# Patient Record
Sex: Female | Born: 1950 | Race: White | Hispanic: No | Marital: Single | Smoking: Never smoker
Health system: Southern US, Community
[De-identification: ages and names within clinical notes are randomized; demographics above are authoritative.]

## PROBLEM LIST (undated history)

## (undated) DIAGNOSIS — F319 Bipolar disorder, unspecified: Secondary | ICD-10-CM

---

## 2018-08-12 ENCOUNTER — Emergency Department: Payer: Self-pay

## 2018-08-12 ENCOUNTER — Other Ambulatory Visit: Payer: Self-pay

## 2018-08-12 ENCOUNTER — Inpatient Hospital Stay
Admission: EM | Admit: 2018-08-12 | Discharge: 2018-08-13 | DRG: 918 | Disposition: A | Discharge status: Home / Self Care | Payer: Self-pay | Attending: Internal Medicine | Admitting: Internal Medicine

## 2018-08-12 DIAGNOSIS — Z79899 Other long term (current) drug therapy: Secondary | ICD-10-CM

## 2018-08-12 DIAGNOSIS — T43591A Poisoning by other antipsychotics and neuroleptics, accidental (unintentional), initial encounter: Principal | ICD-10-CM | POA: Diagnosis present

## 2018-08-12 DIAGNOSIS — R112 Nausea with vomiting, unspecified: Secondary | ICD-10-CM | POA: Diagnosis present

## 2018-08-12 DIAGNOSIS — J069 Acute upper respiratory infection, unspecified: Secondary | ICD-10-CM | POA: Diagnosis present

## 2018-08-12 DIAGNOSIS — Z23 Encounter for immunization: Secondary | ICD-10-CM

## 2018-08-12 DIAGNOSIS — N179 Acute kidney failure, unspecified: Secondary | ICD-10-CM | POA: Diagnosis present

## 2018-08-12 DIAGNOSIS — T56891A Toxic effect of other metals, accidental (unintentional), initial encounter: Secondary | ICD-10-CM | POA: Diagnosis present

## 2018-08-12 DIAGNOSIS — R9431 Abnormal electrocardiogram [ECG] [EKG]: Secondary | ICD-10-CM | POA: Diagnosis present

## 2018-08-12 DIAGNOSIS — E871 Hypo-osmolality and hyponatremia: Secondary | ICD-10-CM | POA: Diagnosis present

## 2018-08-12 DIAGNOSIS — F319 Bipolar disorder, unspecified: Secondary | ICD-10-CM | POA: Diagnosis present

## 2018-08-12 DIAGNOSIS — E876 Hypokalemia: Secondary | ICD-10-CM | POA: Diagnosis present

## 2018-08-12 HISTORY — DX: Bipolar disorder, unspecified: F31.9

## 2018-08-12 LAB — TROPONIN I

## 2018-08-12 LAB — CBC WITH DIFFERENTIAL/PLATELET
Abs Immature Granulocytes: 0.19 10*3/uL — ABNORMAL HIGH (ref 0.00–0.07)
Basophils Absolute: 0.1 10*3/uL (ref 0.0–0.1)
Basophils Relative: 0 %
Eosinophils Absolute: 0.4 10*3/uL (ref 0.0–0.5)
Eosinophils Relative: 2 %
HCT: 37.4 % (ref 36.0–46.0)
HEMOGLOBIN: 12.7 g/dL (ref 12.0–15.0)
Immature Granulocytes: 1 %
Lymphocytes Relative: 13 %
Lymphs Abs: 2.3 10*3/uL (ref 0.7–4.0)
MCH: 30.8 pg (ref 26.0–34.0)
MCHC: 34 g/dL (ref 30.0–36.0)
MCV: 90.6 fL (ref 80.0–100.0)
Monocytes Absolute: 1.1 10*3/uL — ABNORMAL HIGH (ref 0.1–1.0)
Monocytes Relative: 7 %
Neutro Abs: 13.4 10*3/uL — ABNORMAL HIGH (ref 1.7–7.7)
Neutrophils Relative %: 77 %
Platelets: 371 10*3/uL (ref 150–400)
RBC: 4.13 MIL/uL (ref 3.87–5.11)
RDW: 13.6 % (ref 11.5–15.5)
WBC: 17.4 10*3/uL — ABNORMAL HIGH (ref 4.0–10.5)
nRBC: 0 % (ref 0.0–0.2)

## 2018-08-12 LAB — URINALYSIS, COMPLETE (UACMP) WITH MICROSCOPIC
Bilirubin Urine: NEGATIVE
Glucose, UA: NEGATIVE mg/dL
Hgb urine dipstick: NEGATIVE
Ketones, ur: NEGATIVE mg/dL
Leukocytes, UA: NEGATIVE
Nitrite: NEGATIVE
Protein, ur: NEGATIVE mg/dL
Specific Gravity, Urine: 1.011 (ref 1.005–1.030)
pH: 6 (ref 5.0–8.0)

## 2018-08-12 LAB — COMPREHENSIVE METABOLIC PANEL
ALBUMIN: 3.3 g/dL — AB (ref 3.5–5.0)
ALT: 15 U/L (ref 0–44)
ALT: 18 U/L (ref 0–44)
AST: 23 U/L (ref 15–41)
AST: 21 U/L (ref 15–41)
Albumin: 4.1 g/dL (ref 3.5–5.0)
Alkaline Phosphatase: 193 U/L — ABNORMAL HIGH (ref 38–126)
Alkaline Phosphatase: 155 U/L — ABNORMAL HIGH (ref 38–126)
Anion gap: 10 (ref 5–15)
Anion gap: 7 (ref 5–15)
BUN: 14 mg/dL (ref 8–23)
BUN: 14 mg/dL (ref 8–23)
CO2: 23 mmol/L (ref 22–32)
CO2: 19 mmol/L — ABNORMAL LOW (ref 22–32)
CREATININE: 1.35 mg/dL — AB (ref 0.44–1.00)
Calcium: 9 mg/dL (ref 8.9–10.3)
Calcium: 7.9 mg/dL — ABNORMAL LOW (ref 8.9–10.3)
Chloride: 101 mmol/L (ref 98–111)
Chloride: 109 mmol/L (ref 98–111)
Creatinine, Ser: 1.23 mg/dL — ABNORMAL HIGH (ref 0.44–1.00)
GFR calc Af Amer: 53 mL/min — ABNORMAL LOW (ref >60)
GFR calc non Af Amer: 41 mL/min — ABNORMAL LOW (ref >60)
GFR calc non Af Amer: 46 mL/min — ABNORMAL LOW (ref >60)
GFR, EST AFRICAN AMERICAN: 47 mL/min — AB (ref >60)
Glucose, Bld: 225 mg/dL — ABNORMAL HIGH (ref 70–99)
Glucose, Bld: 225 mg/dL — ABNORMAL HIGH (ref 70–99)
Potassium: 2.4 mmol/L — CL (ref 3.5–5.1)
Potassium: 3 mmol/L — ABNORMAL LOW (ref 3.5–5.1)
Sodium: 134 mmol/L — ABNORMAL LOW (ref 135–145)
Sodium: 135 mmol/L (ref 135–145)
Total Bilirubin: 0.8 mg/dL (ref 0.3–1.2)
Total Bilirubin: 0.7 mg/dL (ref 0.3–1.2)
Total Protein: 7.7 g/dL (ref 6.5–8.1)
Total Protein: 6.4 g/dL — ABNORMAL LOW (ref 6.5–8.1)

## 2018-08-12 LAB — BASIC METABOLIC PANEL
ANION GAP: 5 (ref 5–15)
BUN: 13 mg/dL (ref 8–23)
CALCIUM: 7.8 mg/dL — AB (ref 8.9–10.3)
CO2: 21 mmol/L — ABNORMAL LOW (ref 22–32)
Chloride: 109 mmol/L (ref 98–111)
Creatinine, Ser: 1.17 mg/dL — ABNORMAL HIGH (ref 0.44–1.00)
GFR calc Af Amer: 56 mL/min — ABNORMAL LOW (ref >60)
GFR calc non Af Amer: 49 mL/min — ABNORMAL LOW (ref >60)
Glucose, Bld: 214 mg/dL — ABNORMAL HIGH (ref 70–99)
Potassium: 3.3 mmol/L — ABNORMAL LOW (ref 3.5–5.1)
SODIUM: 135 mmol/L (ref 135–145)

## 2018-08-12 LAB — LITHIUM LEVEL
LITHIUM LVL: 1.93 mmol/L — AB (ref 0.60–1.20)
Lithium Lvl: 1.76 mmol/L (ref 0.60–1.20)
Lithium Lvl: 1.64 mmol/L (ref 0.60–1.20)
Lithium Lvl: 1.65 mmol/L (ref 0.60–1.20)
Lithium Lvl: 1.59 mmol/L (ref 0.60–1.20)

## 2018-08-12 LAB — URINE DRUG SCREEN, QUALITATIVE (ARMC ONLY)
Amphetamines, Ur Screen: NOT DETECTED
Barbiturates, Ur Screen: NOT DETECTED
Benzodiazepine, Ur Scrn: NOT DETECTED
Cannabinoid 50 Ng, Ur ~~LOC~~: NOT DETECTED
Cocaine Metabolite,Ur ~~LOC~~: NOT DETECTED
MDMA (Ecstasy)Ur Screen: NOT DETECTED
Methadone Scn, Ur: NOT DETECTED
Opiate, Ur Screen: NOT DETECTED
Phencyclidine (PCP) Ur S: NOT DETECTED
Tricyclic, Ur Screen: NOT DETECTED

## 2018-08-12 LAB — TSH: TSH: 2.331 u[IU]/mL (ref 0.350–4.500)

## 2018-08-12 LAB — ETHANOL: Alcohol, Ethyl (B): <10 mg/dL (ref <10)

## 2018-08-12 LAB — SALICYLATE LEVEL: Salicylate Lvl: <7 mg/dL (ref 2.8–30.0)

## 2018-08-12 LAB — INFLUENZA PANEL BY PCR (TYPE A & B)
Influenza A By PCR: NEGATIVE
Influenza B By PCR: NEGATIVE

## 2018-08-12 LAB — POTASSIUM: POTASSIUM: 3.7 mmol/L (ref 3.5–5.1)

## 2018-08-12 LAB — ACETAMINOPHEN LEVEL: Acetaminophen (Tylenol), Serum: <10 ug/mL — ABNORMAL LOW (ref 10–30)

## 2018-08-12 LAB — LIPASE, BLOOD: Lipase: 55 U/L — ABNORMAL HIGH (ref 11–51)

## 2018-08-12 LAB — BRAIN NATRIURETIC PEPTIDE: B Natriuretic Peptide: 22 pg/mL (ref 0.0–100.0)

## 2018-08-12 LAB — PHOSPHORUS
PHOSPHORUS: 1.9 mg/dL — AB (ref 2.5–4.6)
Phosphorus: 1.9 mg/dL — ABNORMAL LOW (ref 2.5–4.6)

## 2018-08-12 LAB — MAGNESIUM
Magnesium: 2.7 mg/dL — ABNORMAL HIGH (ref 1.7–2.4)
Magnesium: 2.7 mg/dL — ABNORMAL HIGH (ref 1.7–2.4)

## 2018-08-12 MED ORDER — ACETAMINOPHEN 650 MG RE SUPP: 650.0000 mg | Freq: Four times a day (QID) | RECTAL | Status: DC | PRN

## 2018-08-12 MED ORDER — MAGNESIUM SULFATE 2 GM/50ML IV SOLN
2.0000 g | Freq: Once | INTRAVENOUS | Status: AC
  Administered 2018-08-12: 2 g via INTRAVENOUS
  Filled 2018-08-12: qty 50

## 2018-08-12 MED ORDER — POTASSIUM CHLORIDE CRYS ER 20 MEQ PO TBCR
40.0000 meq | EXTENDED_RELEASE_TABLET | Freq: Once | ORAL | Status: AC
  Administered 2018-08-12: 40 meq via ORAL
  Filled 2018-08-12: qty 2

## 2018-08-12 MED ORDER — SODIUM CHLORIDE 0.9 % IV BOLUS
1000.0000 mL | Freq: Once | INTRAVENOUS | Status: AC
  Administered 2018-08-12: 1000 mL via INTRAVENOUS

## 2018-08-12 MED ORDER — ACETAMINOPHEN 500 MG PO TABS
1000.0000 mg | ORAL_TABLET | Freq: Once | ORAL | Status: AC
  Administered 2018-08-12: 1000 mg via ORAL
  Filled 2018-08-12: qty 2

## 2018-08-12 MED ORDER — KETOROLAC TROMETHAMINE 30 MG/ML IJ SOLN
15.0000 mg | Freq: Once | INTRAMUSCULAR | Status: AC
  Administered 2018-08-12: 15 mg via INTRAVENOUS
  Filled 2018-08-12: qty 1

## 2018-08-12 MED ORDER — ACETAMINOPHEN 325 MG PO TABS
650.0000 mg | ORAL_TABLET | Freq: Four times a day (QID) | ORAL | Status: DC | PRN
  Administered 2018-08-12 – 2018-08-13 (×3): 650 mg via ORAL
  Filled 2018-08-12 (×3): qty 2

## 2018-08-12 MED ORDER — ENOXAPARIN SODIUM 40 MG/0.4ML ~~LOC~~ SOLN
40.0000 mg | SUBCUTANEOUS | Status: DC
  Administered 2018-08-12: 40 mg via SUBCUTANEOUS
  Filled 2018-08-12: qty 0.4

## 2018-08-12 MED ORDER — ZOLPIDEM TARTRATE 5 MG PO TABS
5.0000 mg | ORAL_TABLET | Freq: Every evening | ORAL | Status: DC | PRN
  Administered 2018-08-13: 5 mg via ORAL
  Filled 2018-08-12: qty 1

## 2018-08-12 MED ORDER — SODIUM CHLORIDE 0.9 % IV SOLN
INTRAVENOUS | Status: DC
  Administered 2018-08-12 – 2018-08-13 (×3): via INTRAVENOUS

## 2018-08-12 MED ORDER — OXYMETAZOLINE HCL 0.05 % NA SOLN
1.0000 | Freq: Once | NASAL | Status: AC
  Administered 2018-08-12: 1 via NASAL
  Filled 2018-08-12: qty 15

## 2018-08-12 MED ORDER — POTASSIUM CHLORIDE CRYS ER 20 MEQ PO TBCR
80.0000 meq | EXTENDED_RELEASE_TABLET | Freq: Once | ORAL | Status: AC
  Administered 2018-08-12: 80 meq via ORAL
  Filled 2018-08-12: qty 4

## 2018-08-12 MED ORDER — DOCUSATE SODIUM 100 MG PO CAPS
100.0000 mg | ORAL_CAPSULE | Freq: Two times a day (BID) | ORAL | Status: DC
  Administered 2018-08-12: 100 mg via ORAL
  Filled 2018-08-12 (×3): qty 1

## 2018-08-12 MED ORDER — ONDANSETRON HCL 4 MG/2ML IJ SOLN
4.0000 mg | Freq: Four times a day (QID) | INTRAMUSCULAR | Status: DC | PRN
  Administered 2018-08-12 (×2): 4 mg via INTRAVENOUS
  Filled 2018-08-12 (×2): qty 2

## 2018-08-12 MED ORDER — ONDANSETRON HCL 4 MG/2ML IJ SOLN
4.0000 mg | Freq: Once | INTRAMUSCULAR | Status: AC
  Administered 2018-08-12: 4 mg via INTRAVENOUS
  Filled 2018-08-12: qty 2

## 2018-08-12 MED ORDER — POTASSIUM PHOSPHATES 15 MMOLE/5ML IV SOLN
25.0000 mmol | Freq: Once | INTRAVENOUS | Status: AC
  Administered 2018-08-12: 25 mmol via INTRAVENOUS
  Filled 2018-08-12: qty 8.33

## 2018-08-12 MED ORDER — PSEUDOEPHEDRINE HCL 30 MG PO TABS
60.0000 mg | ORAL_TABLET | Freq: Once | ORAL | Status: AC
  Administered 2018-08-12: 60 mg via ORAL
  Filled 2018-08-12: qty 2

## 2018-08-12 MED ORDER — INFLUENZA VAC SPLIT HIGH-DOSE 0.5 ML IM SUSY
0.5000 mL | PREFILLED_SYRINGE | INTRAMUSCULAR | Status: AC
  Administered 2018-08-13: 0.5 mL via INTRAMUSCULAR
  Filled 2018-08-12: qty 0.5

## 2018-08-12 NOTE — Consult Note (Signed)
[facility]  CONSULT NOTE    Date: 08/12/2018                  Patient Name:  Jasmine Simmons  MRN: [id number]  DOB: [birth year]  Age / Sex: 68 y.o., female         PCP: Patient, No Pcp Per                 Service Requesting Consult: Dr. Sheryle Hail                 Reason for Consult: Lithium toxicity            History of Present Illness: Jasmine Simmons is a 68 y.o. white female with bipolar disorder, who was admitted to [facility] on 08/12/2018 for QT prolongation [R94.31] Lithium toxicity, accidental or unintentional, initial encounter [T56.891A]  Nephrology consulted. No known baseline creatinine.   Patient states that she was unsure how many lithium tablets she took as she has been sick lately.    Medications: Outpatient medications: No medications prior to admission.    Current medications: Current Facility-Administered Medications  Medication Dose Route Frequency Provider Last Rate Last Dose  . 0.9 %  sodium chloride infusion   Intravenous Continuous Arnaldo Natal, MD 100 mL/hr at 08/12/18 0640    . acetaminophen (TYLENOL) tablet 650 mg  650 mg Oral Q6H PRN Arnaldo Natal, MD   650 mg at 08/12/18 1304   Or  . acetaminophen (TYLENOL) suppository 650 mg  650 mg Rectal Q6H PRN Arnaldo Natal, MD      . docusate sodium (COLACE) capsule 100 mg  100 mg Oral BID Arnaldo Natal, MD   100 mg at 08/12/18 1037  . enoxaparin (LOVENOX) injection 40 mg  40 mg Subcutaneous Q24H Arnaldo Natal, MD      . Melene Muller ON 08/13/2018] Influenza vac split quadrivalent PF (FLUZONE HIGH-DOSE) injection 0.5 mL  0.5 mL Intramuscular Tomorrow-1000 Arnaldo Natal, MD      . ondansetron [facility]) injection 4 mg  4 mg Intravenous Q6H PRN Arnaldo Natal, MD   4 mg at 08/12/18 1310  . potassium PHOSPHATE 25 mmol in dextrose 5 % 500 mL infusion  25 mmol Intravenous Once Little Ishikawa, RPH 85 mL/hr at 08/12/18 1310 25 mmol at 08/12/18 1310       Allergies: No Known Allergies    Past Medical History: Past Medical History:  Diagnosis Date  . Bipolar 1 disorder [facility])      Past Surgical History: Past Surgical History:  Procedure Laterality Date  . CESAREAN SECTION       Family History: Family History  Family history unknown: Yes     Social History: Social History   Socioeconomic History  . Marital status: Single    Spouse name: Not on file  . Number of children: Not on file  . Years of education: Not on file  . Highest education level: Not on file  Occupational History  . Not on file  Social Needs  . Financial resource strain: Not on file  . Food insecurity:    Worry: Not on file    Inability: Not on file  . Transportation needs:    Medical: Not on file    Non-medical: Not on file  Tobacco Use  . Smoking status: Never Smoker  . Smokeless tobacco: Never Used  Substance and Sexual Activity  . Alcohol use: Never    Frequency: Never  .  Drug use: Never  . Sexual activity: Not on file  Lifestyle  . Physical activity:    Days per week: Not on file    Minutes per session: Not on file  . Stress: Not on file  Relationships  . Social connections:    Talks on phone: Not on file    Gets together: Not on file    Attends religious service: Not on file    Active member of club or organization: Not on file    Attends meetings of clubs or organizations: Not on file    Relationship status: Not on file  . Intimate partner violence:    Fear of current or ex partner: Not on file    Emotionally abused: Not on file    Physically abused: Not on file    Forced sexual activity: Not on file  Other Topics Concern  . Not on file  Social History Narrative  . Not on file     Review of Systems: Review of Systems  Constitutional: Positive for malaise/fatigue. Negative for chills, diaphoresis, fever and weight loss.  HENT: Negative.  Negative for congestion, ear discharge, ear pain, hearing loss, nosebleeds,  sinus pain, sore throat and tinnitus.   Eyes: Negative.  Negative for blurred vision, double vision, photophobia, pain, discharge and redness.  Respiratory: Negative.  Negative for cough, hemoptysis, sputum production, shortness of breath, wheezing and stridor.   Cardiovascular: Negative.  Negative for chest pain, palpitations, orthopnea, claudication, leg swelling and PND.  Gastrointestinal: Positive for abdominal pain, heartburn, nausea and vomiting. Negative for blood in stool, constipation, diarrhea and melena.  Genitourinary: Negative.  Negative for dysuria, flank pain, frequency, hematuria and urgency.  Musculoskeletal: Negative.  Negative for back pain, falls, joint pain, myalgias and neck pain.  Skin: Negative.  Negative for itching and rash.  Neurological: Negative.  Negative for dizziness, tingling, tremors, sensory change, speech change, focal weakness, seizures, loss of consciousness, weakness and headaches.  Endo/Heme/Allergies: Negative.  Negative for environmental allergies and polydipsia. Does not bruise/bleed easily.  Psychiatric/Behavioral: Positive for depression and memory loss. Negative for hallucinations, substance abuse and suicidal ideas. The patient is nervous/anxious. The patient does not have insomnia.     Vital Signs: Blood pressure 111/63, pulse 70, temperature 97.6 F (36.4 C), temperature source Oral, resp. rate 20, height 5\' 4"  (1.626 m), weight 82.9 kg, SpO2 97 %.  Weight trends: Filed Weights   08/12/18 0024 08/12/18 0500  Weight: 54.4 kg 82.9 kg    Physical Exam: General: NAD, laying in bed  Head: Normocephalic, atraumatic. Moist oral mucosal membranes  Eyes: Anicteric, PERRL  Neck: Supple, trachea midline  Lungs:  Clear to auscultation  Heart: Regular rate and rhythm  Abdomen:  Soft, nontender,   Extremities:  no peripheral edema.  Neurologic: Nonfocal, moving all four extremities  Skin: No lesions        Lab results: Basic Metabolic  Panel: Recent Labs  Lab 08/12/18 0031 08/12/18 0809 08/12/18 1244  NA 134* 135 135  K 2.4* 3.0* 3.3*  CL 101 109 109  CO2 23 19* 21*  GLUCOSE 225* 225* 214*  BUN 14 14 13   CREATININE 1.35* 1.23* 1.17*  CALCIUM 9.0 7.9* 7.8*  MG  --  2.7* 2.7*  PHOS  --  1.9* 1.9*    Liver Function Tests: Recent Labs  Lab 08/12/18 0031 08/12/18 0809  AST 23 21  ALT 18 15  ALKPHOS 193* 155*  BILITOT 0.8 0.7  PROT 7.7 6.4*  ALBUMIN 4.1  3.3*   Recent Labs  Lab 08/12/18 0031  LIPASE 55*   No results for input(s): AMMONIA in the last 168 hours.  CBC: Recent Labs  Lab 08/12/18 0031  WBC 17.4*  NEUTROABS 13.4*  HGB 12.7  HCT 37.4  MCV 90.6  PLT 371    Cardiac Enzymes: Recent Labs  Lab 08/12/18 0031  TROPONINI <0.03    BNP: Invalid input(s): POCBNP  CBG: No results for input(s): GLUCAP in the last 168 hours.  Microbiology: No results found for this or any previous visit.  Coagulation Studies: No results for input(s): LABPROT, INR in the last 72 hours.  Urinalysis: Recent Labs    08/12/18 0504  COLORURINE YELLOW*  LABSPEC 1.011  PHURINE 6.0  GLUCOSEU NEGATIVE  HGBUR NEGATIVE  BILIRUBINUR NEGATIVE  KETONESUR NEGATIVE  PROTEINUR NEGATIVE  NITRITE NEGATIVE  LEUKOCYTESUR NEGATIVE      Imaging: Dg Chest 2 View  Result Date: 08/12/2018 CLINICAL DATA:  Flu like symptoms and body aches. EXAM: CHEST - 2 VIEW COMPARISON:  None. FINDINGS: The heart size and mediastinal contours are within normal limits. Both lungs are clear. The visualized skeletal structures are unremarkable. IMPRESSION: No active cardiopulmonary disease. Electronically Signed   By: Tollie Ethavid  Kwon M.D.   On: 08/12/2018 01:00      Assessment & Plan: Ms. Oren SectionLeslie O'Dell is a 68 y.o. white female with bipolar disorder, who was admitted to [facility] on 08/12/2018 for QT prolongation [R94.31] Lithium toxicity, accidental or unintentional, initial encounter [T56.891A]  1. Acute renal failure with unknown  baseline 2. Lithium toxicity. Normal range is 0.8-1.2 3. Hyponatremia  Continue IV fluids. Monitor lithium levels.   LOS: 0 Eddrick Dilone 1/24/20202:02 PM

## 2018-08-12 NOTE — H&P (Signed)
Jasmine Simmons is an 68 y.o. female.   Chief Complaint: Nausea HPI: The patient with past medical history of bipolar disorder presents to the emergency department with nausea and vomiting.  Symptoms began yesterday.  The patient has had nonbloody nonbilious emesis.  She obtains a large supply of her medicines from [place].  She admits that she may have taken more lithium than intended due to general malaise and absentmindedness during a brief viral illness that preceded current symptoms by 2 weeks.  The patient was found to have an elevated lithium level which prompted the emergency department staff to call the hospitalist service for admission  Past Medical History:  Diagnosis Date  . Bipolar 1 disorder [facility])     Past Surgical History:  Procedure Laterality Date  . CESAREAN SECTION      Family History  Family history unknown: Yes   Social History:  reports that she has never smoked. She has never used smokeless tobacco. She reports that she does not drink alcohol or use drugs.  Allergies: No Known Allergies  No medications prior to admission.    Results for orders placed or performed during the hospital encounter of 08/12/18 (from the past 48 hour(s))  Comprehensive metabolic panel     Status: Abnormal   Collection Time: 08/12/18 12:31 AM  Result Value Ref Range   Sodium 134 (L) 135 - 145 mmol/L   Potassium 2.4 (LL) 3.5 - 5.1 mmol/L    Comment: CRITICAL RESULT CALLED TO, READ BACK BY AND VERIFIED WITH GRACIE WINEMEN @0116  08/12/2018 TTG    Chloride 101 98 - 111 mmol/L   CO2 23 22 - 32 mmol/L   Glucose, Bld 225 (H) 70 - 99 mg/dL   BUN 14 8 - 23 mg/dL   Creatinine, Ser 7.91 (H) 0.44 - 1.00 mg/dL   Calcium 9.0 8.9 - 50.5 mg/dL   Total Protein 7.7 6.5 - 8.1 g/dL   Albumin 4.1 3.5 - 5.0 g/dL   AST 23 15 - 41 U/L   ALT 18 0 - 44 U/L   Alkaline Phosphatase 193 (H) 38 - 126 U/L   Total Bilirubin 0.8 0.3 - 1.2 mg/dL   GFR calc non Af Amer 41 (L) >60 mL/min   GFR calc Af Amer 47 (L)  >60 mL/min   Anion gap 10 5 - 15    Comment: Performed at [facility], [address] [place], [place] [address]  Lipase, blood     Status: Abnormal   Collection Time: 08/12/18 12:31 AM  Result Value Ref Range   Lipase 55 (H) 11 - 51 U/L    Comment: Performed at [facility], [address] [place], [place] [address]  CBC with Differential     Status: Abnormal   Collection Time: 08/12/18 12:31 AM  Result Value Ref Range   WBC 17.4 (H) 4.0 - 10.5 K/uL   RBC 4.13 3.87 - 5.11 MIL/uL   Hemoglobin 12.7 12.0 - 15.0 g/dL   HCT 53.7 48.2 - 70.7 %   MCV 90.6 80.0 - 100.0 fL   MCH 30.8 26.0 - 34.0 pg   MCHC 34.0 30.0 - 36.0 g/dL   RDW 86.7 54.4 - 92.0 %   Platelets 371 150 - 400 K/uL   nRBC 0.0 0.0 - 0.2 %   Neutrophils Relative % 77 %   Neutro Abs 13.4 (H) 1.7 - 7.7 K/uL   Lymphocytes Relative 13 %   Lymphs Abs 2.3 0.7 - 4.0 K/uL  Monocytes Relative 7 %   Monocytes Absolute 1.1 (H) 0.1 - 1.0 K/uL   Eosinophils Relative 2 %   Eosinophils Absolute 0.4 0.0 - 0.5 K/uL   Basophils Relative 0 %   Basophils Absolute 0.1 0.0 - 0.1 K/uL   Immature Granulocytes 1 %   Abs Immature Granulocytes 0.19 (H) 0.00 - 0.07 K/uL    Comment: Performed at [facility], [address], [place], [place] [address]  Troponin I - Once     Status: None   Collection Time: 08/12/18 12:31 AM  Result Value Ref Range   Troponin I <0.03 <0.03 ng/mL    Comment: Performed at [facility], [address] [place], [place] [address]  Brain natriuretic peptide     Status: None   Collection Time: 08/12/18 12:31 AM  Result Value Ref Range   B Natriuretic Peptide 22.0 0.0 - 100.0 pg/mL    Comment: Performed at [facility], [address] [place], [place] [address]  Influenza panel by PCR (type A & B)     Status: None   Collection Time: 08/12/18 12:31 AM  Result Value Ref Range   Influenza A By PCR NEGATIVE NEGATIVE   Influenza B By PCR NEGATIVE NEGATIVE     Comment: (NOTE) The Xpert Xpress Flu assay is intended as an aid in the diagnosis of  influenza and should not be used as a sole basis for treatment.  This  assay is FDA approved for nasopharyngeal swab specimens only. Nasal  washings and aspirates are unacceptable for Xpert Xpress Flu testing. Performed at [facility], [address] [place], [place] [address]   Lithium level     Status: Abnormal   Collection Time: 08/12/18 12:31 AM  Result Value Ref Range   Lithium Lvl 1.93 (HH) 0.60 - 1.20 mmol/L    Comment: CRITICAL RESULT CALLED TO, READ BACK BY AND VERIFIED WITH GRACIE [facility] @0116  08/12/2018 TTG Performed at [facility], [address] [place], [place] [address]   Acetaminophen level     Status: Abnormal   Collection Time: 08/12/18 12:31 AM  Result Value Ref Range   Acetaminophen (Tylenol), Serum <10 (L) 10 - 30 ug/mL    Comment: (NOTE) Therapeutic concentrations vary significantly. A range of 10-30 ug/mL  may be an effective concentration for many patients. However, some  are best treated at concentrations outside of this range. Acetaminophen concentrations >150 ug/mL at 4 hours after ingestion  and >50 ug/mL at 12 hours after ingestion are often associated with  toxic reactions. Performed at [facility], [address] [place], [place] [address]   Salicylate level     Status: None   Collection Time: 08/12/18 12:31 AM  Result Value Ref Range   Salicylate Lvl <7.0 2.8 - 30.0 mg/dL    Comment: Performed at [facility], [address] [place], [place] [address]  Ethanol     Status: None   Collection Time: 08/12/18 12:31 AM  Result Value Ref Range   Alcohol, Ethyl (B) <10 <10 mg/dL    Comment: (NOTE) Lowest detectable limit for serum alcohol is 10 mg/dL. For medical purposes only. Performed at [facility], [address] [place], [place] [address]   Lithium level     Status: Abnormal   Collection Time:  08/12/18  4:47 AM  Result Value Ref Range   Lithium Lvl 1.76 (HH) 0.60 - 1.20 mmol/L    Comment: CRITICAL RESULT CALLED TO, READ BACK  BY AND VERIFIED WITH STACEY CLAY AT 1610 08/12/2018.PMF Performed at [facility], [address] [place], [place] [address]   TSH     Status: None   Collection Time: 08/12/18  4:47 AM  Result Value Ref Range   TSH 2.331 0.350 - 4.500 uIU/mL    Comment: Performed by a 3rd Generation assay with a functional sensitivity of <=0.01 uIU/mL. Performed at [facility], [address] [place], [place] [address]   Urinalysis, Complete w Microscopic     Status: Abnormal   Collection Time: 08/12/18  5:04 AM  Result Value Ref Range   Color, Urine YELLOW (A) YELLOW   APPearance CLEAR (A) CLEAR   Specific Gravity, Urine 1.011 1.005 - 1.030   pH 6.0 5.0 - 8.0   Glucose, UA NEGATIVE NEGATIVE mg/dL   Hgb urine dipstick NEGATIVE NEGATIVE   Bilirubin Urine NEGATIVE NEGATIVE   Ketones, ur NEGATIVE NEGATIVE mg/dL   Protein, ur NEGATIVE NEGATIVE mg/dL   Nitrite NEGATIVE NEGATIVE   Leukocytes, UA NEGATIVE NEGATIVE   RBC / HPF 0-5 0 - 5 RBC/hpf   WBC, UA 0-5 0 - 5 WBC/hpf   Bacteria, UA RARE (A) NONE SEEN   Squamous Epithelial / LPF 0-5 0 - 5   Mucus PRESENT     Comment: Performed at [facility], [address] [place], [place] [address]  Urine Drug Screen, Qualitative     Status: None   Collection Time: 08/12/18  5:04 AM  Result Value Ref Range   Tricyclic, Ur Screen NONE DETECTED NONE DETECTED   Amphetamines, Ur Screen NONE DETECTED NONE DETECTED   MDMA (Ecstasy)Ur Screen NONE DETECTED NONE DETECTED   Cocaine Metabolite,Ur [place] NONE DETECTED NONE DETECTED   Opiate, Ur Screen NONE DETECTED NONE DETECTED   Phencyclidine (PCP) Ur S NONE DETECTED NONE DETECTED   Cannabinoid 50 Ng, Ur [place] NONE DETECTED NONE DETECTED   Barbiturates, Ur Screen NONE DETECTED NONE DETECTED   Benzodiazepine, Ur Scrn NONE DETECTED NONE DETECTED   Methadone  Scn, Ur NONE DETECTED NONE DETECTED    Comment: (NOTE) Tricyclics + metabolites, urine    Cutoff 1000 ng/mL Amphetamines + metabolites, urine  Cutoff 1000 ng/mL MDMA (Ecstasy), urine              Cutoff 500 ng/mL Cocaine Metabolite, urine          Cutoff 300 ng/mL Opiate + metabolites, urine        Cutoff 300 ng/mL Phencyclidine (PCP), urine         Cutoff 25 ng/mL Cannabinoid, urine                 Cutoff 50 ng/mL Barbiturates + metabolites, urine  Cutoff 200 ng/mL Benzodiazepine, urine              Cutoff 200 ng/mL Methadone, urine                   Cutoff 300 ng/mL The urine drug screen provides only a preliminary, unconfirmed analytical test result and should not be used for non-medical purposes. Clinical consideration and professional judgment should be applied to any positive drug screen result due to possible interfering substances. A more specific alternate chemical method must be used in order to obtain a confirmed analytical result. Gas chromatography / mass spectrometry (GC/MS) is the preferred confirmat ory method. Performed at [facility], [address] [place], [place] [address]    Dg Chest 2 View  Result Date:  08/12/2018 CLINICAL DATA:  Flu like symptoms and body aches. EXAM: CHEST - 2 VIEW COMPARISON:  None. FINDINGS: The heart size and mediastinal contours are within normal limits. Both lungs are clear. The visualized skeletal structures are unremarkable. IMPRESSION: No active cardiopulmonary disease. Electronically Signed   By: Tollie Ethavid  Kwon M.D.   On: 08/12/2018 01:00    Review of Systems  Constitutional: Positive for malaise/fatigue. Negative for chills and fever.  HENT: Negative for sore throat and tinnitus.   Eyes: Negative for blurred vision and redness.  Respiratory: Negative for cough and shortness of breath.   Cardiovascular: Negative for chest pain, palpitations, orthopnea and PND.  Gastrointestinal: Positive for nausea and vomiting. Negative  for abdominal pain and diarrhea.  Genitourinary: Negative for dysuria, frequency and urgency.  Musculoskeletal: Negative for joint pain and myalgias.  Skin: Negative for rash.       No lesions  Neurological: Positive for tingling. Negative for speech change, focal weakness and weakness.  Endo/Heme/Allergies: Does not bruise/bleed easily.       No temperature intolerance  Psychiatric/Behavioral: Negative for depression and suicidal ideas.    Blood pressure 134/76, pulse 75, temperature 98.4 F (36.9 C), temperature source Oral, resp. rate 20, height 5\' 4"  (1.626 m), weight 82.9 kg, SpO2 96 %. Physical Exam  Vitals reviewed. Constitutional: She is oriented to person, place, and time. She appears well-developed and well-nourished. No distress.  HENT:  Head: Normocephalic and atraumatic.  Mouth/Throat: Oropharynx is clear and moist.  Eyes: Pupils are equal, round, and reactive to light. Conjunctivae and EOM are normal.  Neck: Normal range of motion. Neck supple. No JVD present. No tracheal deviation present. No thyromegaly present.  Cardiovascular: Normal rate, regular rhythm and normal heart sounds. Exam reveals no gallop and no friction rub.  No murmur heard. Respiratory: Effort normal and breath sounds normal. No respiratory distress. She has no wheezes.  GI: Soft. Bowel sounds are normal. She exhibits no distension. There is no abdominal tenderness.  Genitourinary:    Genitourinary Comments: Deferred   Musculoskeletal: Normal range of motion.        General: No edema.  Lymphadenopathy:    She has no cervical adenopathy.  Neurological: She is alert and oriented to person, place, and time. No cranial nerve deficit.  Skin: Skin is warm and dry. No rash noted. No erythema.  Psychiatric: Her behavior is normal. Judgment and thought content normal.     Assessment/Plan This is a 68 year old female admitted for lithium toxicity. 1.  Lithium toxicity: Patient does not meet criteria for  dialysis at this time.  Nonetheless we will consult nephrology for further guidance.  Monitor telemetry and urine output. 2.  Acute kidney injury: Secondary to poor p.o. intake and/or vomiting.  Hydrate with intravenous fluid.  Avoid nephrotoxic agents. 3.  Hypokalemia: Replete potassium 4.  Leukocytosis: Secondary to viral URI.  Monitor for signs or symptoms of sepsis 5.  DVT prophylaxis: Lovenox 6.  GI prophylaxis: None The patient is a full code.  Time spent on admission orders and patient care approximately 45 minutes  Arnaldo Nataliamond,  Heleena Miceli S, MD 08/12/2018, 8:29 AM

## 2018-08-12 NOTE — ED Notes (Signed)
Pt informed of urine sample needed.  

## 2018-08-12 NOTE — Progress Notes (Signed)
MD Messaged: Per night shift nurse, poison control called this morning around 6am. to see if labs could be order for this patient. They requested electrolytes to be obtained, magnesium, and EKG to be obtained.  They also requested a lithium level yet this is order already q4 and you just order a new level.

## 2018-08-12 NOTE — Progress Notes (Signed)
Called Dr. Anne Hahn regarding sleep medication to help with patient's anxiety.  Appropriate orders were placed.  Jasmine Simmons  08/12/2018  11:56 PM

## 2018-08-12 NOTE — Progress Notes (Signed)
Can you add additional PRN pain medication. Pain level 8/10.

## 2018-08-12 NOTE — ED Notes (Signed)
Date and time results received: 08/12/18 1:17 AM  (use smartphrase ".now" to insert current time)  Test: lithium/potassium Critical Value: 1.93/2.4  Name of Provider Notified: [email or web address]  Orders Received? Or Actions Taken?: Orders Received - See Orders for details

## 2018-08-12 NOTE — ED Triage Notes (Signed)
Pt to ED via EMS from home. Pt c/o flu liek symptoms, generalized body aches, n/v/d x2weeks. Pt has hx of bipolar disorder, takes prescribed lithium and has tremors from that. NAD. VSS.

## 2018-08-12 NOTE — ED Provider Notes (Signed)
[facility] Emergency Department Provider Note  ____________________________________________   First MD Initiated Contact with Patient 08/12/18 0022     (approximate)  I have reviewed the triage vital signs and the nursing notes.   HISTORY  Chief Complaint Nausea and Emesis   HPI Jasmine Simmons is a 68 y.o. female who comes to the emergency department via EMS with roughly 2 weeks of generalized malaise and "flulike symptoms".  Her symptoms have been gradual onset slowly progressive are now constant.  She came to the emergency department tonight because she feels tingling in all 4 extremities and says that she cannot stop shaking.  She denies fevers or chills.  She does report generalized malaise and body aches.  Some nausea but no vomiting.  Several loose stools.  No cough or shortness of breath.  She has a past medical history of bipolar 1 for which she takes lithium.  She did not take her daily lithium today.    Past Medical History:  Diagnosis Date  . Bipolar 1 disorder [facility](HCC)     Patient Active Problem List   Diagnosis Date Noted  . Lithium toxicity 08/12/2018      Prior to Admission medications   Not on File    Allergies Patient has no known allergies.  Family History  Family history unknown: Yes    Social History Social History   Tobacco Use  . Smoking status: Never Smoker  . Smokeless tobacco: Never Used  Substance Use Topics  . Alcohol use: Never    Frequency: Never  . Drug use: Never    Review of Systems Constitutional: No fever/chills Eyes: No visual changes. ENT: No sore throat. Cardiovascular: Denies chest pain. Respiratory: Denies shortness of breath. Gastrointestinal: Positive for abdominal pain.  Positive for nausea, positive for vomiting.  Positive for diarrhea.  No constipation. Genitourinary: Negative for dysuria. Musculoskeletal: Negative for back pain. Skin: Negative for rash. Neurological: Negative for  headaches, focal weakness or numbness.   ____________________________________________   PHYSICAL EXAM:  VITAL SIGNS: ED Triage Vitals  Enc Vitals Group     BP      Pulse      Resp      Temp      Temp src      SpO2      Weight      Height      Head Circumference      Peak Flow      Pain Score      Pain Loc      Pain Edu?      Excl. in GC?     Constitutional: Alert and oriented x4 somewhat anxious appearing although nontoxic no diaphoresis Eyes: PERRL EOMI. midrange and brisk no nystagmus Head: Atraumatic. Nose: No congestion/rhinnorhea. Mouth/Throat: No trismus Neck: No stridor.  No meningismus Cardiovascular: Normal rate, regular rhythm. Grossly normal heart sounds.  Good peripheral circulation. Respiratory: Normal respiratory effort.  No retractions. Lungs CTAB and moving good air Gastrointestinal: Soft nontender Musculoskeletal: No lower extremity edema   Neurologic:  Normal speech and language.  5-5 strength all 4 extremities.  She does have mild hand tremors and 3-4 beats of ankle clonus bilaterally.  3+ DTRs Skin:  Skin is warm, dry and intact. No rash noted. Psychiatric: Anxious appearing    ____________________________________________   DIFFERENTIAL includes but not limited to  Influenza, viral syndrome, dehydration, metabolic derangement, lithium toxicity ____________________________________________   LABS (all labs ordered are listed, but only abnormal results are displayed)  Labs Reviewed  COMPREHENSIVE METABOLIC PANEL - Abnormal; Notable for the following components:      Result Value   Sodium 134 (*)    Potassium 2.4 (*)    Glucose, Bld 225 (*)    Creatinine, Ser 1.35 (*)    Alkaline Phosphatase 193 (*)    GFR calc non Af Amer 41 (*)    GFR calc Af Amer 47 (*)    All other components within normal limits  LIPASE, BLOOD - Abnormal; Notable for the following components:   Lipase 55 (*)    All other components within normal limits  CBC WITH  DIFFERENTIAL/PLATELET - Abnormal; Notable for the following components:   WBC 17.4 (*)    Neutro Abs 13.4 (*)    Monocytes Absolute 1.1 (*)    Abs Immature Granulocytes 0.19 (*)    All other components within normal limits  LITHIUM LEVEL - Abnormal; Notable for the following components:   Lithium Lvl 1.93 (*)    All other components within normal limits  ACETAMINOPHEN LEVEL - Abnormal; Notable for the following components:   Acetaminophen (Tylenol), Serum <10 (*)    All other components within normal limits  URINALYSIS, COMPLETE (UACMP) WITH MICROSCOPIC - Abnormal; Notable for the following components:   Color, Urine YELLOW (*)    APPearance CLEAR (*)    Bacteria, UA RARE (*)    All other components within normal limits  LITHIUM LEVEL - Abnormal; Notable for the following components:   Lithium Lvl 1.76 (*)    All other components within normal limits  TROPONIN I  BRAIN NATRIURETIC PEPTIDE  INFLUENZA PANEL BY PCR (TYPE A & B)  SALICYLATE LEVEL  ETHANOL  URINE DRUG SCREEN, QUALITATIVE ([facility] ONLY)  TSH  LITHIUM LEVEL  LITHIUM LEVEL  LITHIUM LEVEL  MAGNESIUM  POTASSIUM  PHOSPHORUS  COMPREHENSIVE METABOLIC PANEL    Lab work reviewed by me shows the patient's lithium level is in the toxic range.  We have no baseline labs however her GFR is low.  Her potassium is quite low at 2.4 __________________________________________  EKG  ED ECG REPORT I, Merrily Brittle, the attending physician, personally viewed and interpreted this ECG.  Date: 08/12/2018 EKG Time:  Rate: 79 Rhythm: normal sinus rhythm QRS Axis: normal Intervals: Very prolonged QTC ST/T Wave abnormalities: normal Narrative Interpretation: no evidence of acute ischemia  ____________________________________________  RADIOLOGY  Chest x-ray reviewed by me with no acute disease ____________________________________________   PROCEDURES  Procedure(s) performed: no  .Critical Care Performed by: Merrily Brittle, MD Authorized by: Merrily Brittle, MD   Critical care provider statement:    Critical care time (minutes):  35   Critical care time was exclusive of:  Separately billable procedures and treating other patients   Critical care was necessary to treat or prevent imminent or life-threatening deterioration of the following conditions:  Toxidrome   Critical care was time spent personally by me on the following activities:  Development of treatment plan with patient or surrogate, discussions with consultants, evaluation of patient's response to treatment, examination of patient, obtaining history from patient or surrogate, ordering and performing treatments and interventions, ordering and review of laboratory studies, ordering and review of radiographic studies, pulse oximetry, re-evaluation of patient's condition and review of old charts    Critical Care performed: Yes  ____________________________________________   INITIAL IMPRESSION / ASSESSMENT AND PLAN / ED COURSE  Pertinent labs & imaging results that were available during my care of the patient were reviewed by me  and considered in my medical decision making (see chart for details).   As part of my medical decision making, I reviewed the following data within the electronic MEDICAL RECORD NUMBER History obtained from family if available, nursing notes, old chart and ekg, as well as notes from prior ED visits.  The patient comes to the emergency department with roughly 2 weeks of generalized malaise nausea vomiting diarrhea and myalgias.  She is somewhat tremulous.  Differential is broad but includes infectious versus metabolic and most notably she is on lithium and she could have lithium toxicity.  We will start with a liter of fluids and broad labs are pending.     I discussed the case with [facility] who recommends IV fluids to get her urine output to 1 to 2 cc/kg/h and to check lithium levels every 4 hours until her level  peaks.  No indication for hemodialysis as she has normal mental status despite her tingling, tremors, and hyper reflexivity.  Her QTC is quite prolonged at 557 so in addition to repleting her potassium aggressively we will also give her 2 g of magnesium.  I discussed with the hospitalist Dr. Anne HahnWillis who has graciously agreed to admit the patient to his service. ____________________________________________   FINAL CLINICAL IMPRESSION(S) / ED DIAGNOSES  Final diagnoses:  Lithium toxicity, accidental or unintentional, initial encounter  QT prolongation  Hypokalemia      NEW MEDICATIONS STARTED DURING THIS VISIT:  There are no discharge medications for this patient.    Note:  This document was prepared using Dragon voice recognition software and may include unintentional dictation errors.    Merrily Brittleifenbark, Jurnie Garritano, MD 08/12/18 [phone]

## 2018-08-12 NOTE — Progress Notes (Signed)
The patient is complaining of nause and Tremurs.

## 2018-08-12 NOTE — Consult Note (Signed)
PHARMACY CONSULT NOTE - FOLLOW UP  Pharmacy Consult for Electrolyte Monitoring and Replacement   Recent Labs: Potassium (mmol/L)  Date Value  08/12/2018 2.4 (LL)   Calcium (mg/dL)  Date Value  51/88/4166 9.0   Albumin (g/dL)  Date Value  01/17/1600 4.1   Sodium (mmol/L)  Date Value  08/12/2018 134 (L)     Assessment: 68 yo female with electrolyte imbalances.  Pharmacy consulted to assist in electrolyte management.  Goal of Therapy:  K ~ 4.0 Mg ~ 2.0 Phos - 2.5-4.5  Plan:  K - 3.0, s/p MD ordered KCl PO 80 mEq once in ED.  KPhos will provide approximately 36 mEq K+.  Will order an additional PO KCl 40 mEq once.  Will check K+ level at 1800.  Mg - 2.7, will hold replacement as level currently supratherapeutic.  Phos - 1.9, will order Potassium Phos IV 25 mmol.  Will recheck phos level with morning labs.  Will order BMP and Phos with morning labs.  Orinda Kenner ,PharmD Clinical Pharmacist 08/12/2018 8:04 AM

## 2018-08-12 NOTE — Progress Notes (Signed)
Patient ID: Jasmine Simmons, female   DOB: Sep 22, 1950, 68 y.o.   MRN: [id number]   Patient seen and chart reviewed.  Agree with admitting MD plan.  Lithium level ordered for tomorrow.

## 2018-08-12 NOTE — Consult Note (Signed)
PHARMACY CONSULT NOTE - FOLLOW UP  Pharmacy Consult for Electrolyte Monitoring and Replacement   Recent Labs: Potassium (mmol/L)  Date Value  08/12/2018 3.7   Magnesium (mg/dL)  Date Value  32/20/2542 2.7 (H)   Calcium (mg/dL)  Date Value  70/62/3762 7.8 (L)   Albumin (g/dL)  Date Value  83/15/1761 3.3 (L)   Phosphorus (mg/dL)  Date Value  60/73/7106 1.9 (L)   Sodium (mmol/L)  Date Value  08/12/2018 135     Assessment: 68 yo female with electrolyte imbalances.  Pharmacy consulted to assist in electrolyte management.  Goal of Therapy:  K ~ 4.0 Mg ~ 2.0 Phos - 2.5-4.5  Plan:  1/24 K: 3.7. KPhos IV still infusing. No additional potassium replacement needed at this time.   Will order BMP and Phos with morning labs.  Gardner Candle, PharmD, BCPS Clinical Pharmacist 08/12/2018 6:22 PM

## 2018-08-12 NOTE — Progress Notes (Signed)
Potassium level is 2.4

## 2018-08-13 LAB — BASIC METABOLIC PANEL
Anion gap: 4 — ABNORMAL LOW (ref 5–15)
BUN: 11 mg/dL (ref 8–23)
CO2: 18 mmol/L — ABNORMAL LOW (ref 22–32)
Calcium: 7.3 mg/dL — ABNORMAL LOW (ref 8.9–10.3)
Chloride: 115 mmol/L — ABNORMAL HIGH (ref 98–111)
Creatinine, Ser: 1.06 mg/dL — ABNORMAL HIGH (ref 0.44–1.00)
GFR calc Af Amer: >60 mL/min (ref >60)
GFR calc non Af Amer: 55 mL/min — ABNORMAL LOW (ref >60)
Glucose, Bld: 197 mg/dL — ABNORMAL HIGH (ref 70–99)
Potassium: 3.3 mmol/L — ABNORMAL LOW (ref 3.5–5.1)
Sodium: 137 mmol/L (ref 135–145)

## 2018-08-13 LAB — LITHIUM LEVEL
Lithium Lvl: 1.05 mmol/L (ref 0.60–1.20)
Lithium Lvl: 1.15 mmol/L (ref 0.60–1.20)
Lithium Lvl: 1.24 mmol/L — ABNORMAL HIGH (ref 0.60–1.20)
Lithium Lvl: 1.32 mmol/L — ABNORMAL HIGH (ref 0.60–1.20)
Lithium Lvl: 1.47 mmol/L — ABNORMAL HIGH (ref 0.60–1.20)

## 2018-08-13 LAB — PHOSPHORUS: Phosphorus: 2.4 mg/dL — ABNORMAL LOW (ref 2.5–4.6)

## 2018-08-13 MED ORDER — LITHIUM CARBONATE 300 MG PO CAPS: 900.0000 mg | ORAL_CAPSULE | Freq: Two times a day (BID) | ORAL | 0 refills | Status: DC

## 2018-08-13 MED ORDER — K PHOS MONO-SOD PHOS DI & MONO 155-852-130 MG PO TABS
500.0000 mg | ORAL_TABLET | ORAL | Status: AC
  Administered 2018-08-13 (×2): 500 mg via ORAL
  Filled 2018-08-13 (×2): qty 2

## 2018-08-13 MED ORDER — POTASSIUM CHLORIDE CRYS ER 20 MEQ PO TBCR
20.0000 meq | EXTENDED_RELEASE_TABLET | Freq: Once | ORAL | Status: AC
  Administered 2018-08-13: 20 meq via ORAL
  Filled 2018-08-13: qty 1

## 2018-08-13 NOTE — Consult Note (Addendum)
PHARMACY CONSULT NOTE - FOLLOW UP  Pharmacy Consult for Electrolyte Monitoring and Replacement   Recent Labs: Potassium (mmol/L)  Date Value  08/13/2018 3.3 (L)   Magnesium (mg/dL)  Date Value  53/29/9242 2.7 (H)   Calcium (mg/dL)  Date Value  68/34/1962 7.3 (L)   Albumin (g/dL)  Date Value  22/97/9892 3.3 (L)   Phosphorus (mg/dL)  Date Value  11/94/1740 2.4 (L)   Sodium (mmol/L)  Date Value  08/13/2018 137     Assessment: 68 yo female with electrolyte imbalances.  Pharmacy consulted to assist in electrolyte management.  Goal of Therapy:  K ~ 4.0 Mg ~ 2.0 Phos - 2.5-4.5  Plan:  1/25 K: 3.3, Phos:2.4. Will order KCL x 1 and KPhos Neutral tabs 500mg  every 4 hours x 2 doses.   Will F/U with AM labs and continue to replace electrolytes as needed.   Gardner Candle, PharmD, BCPS Clinical Pharmacist 08/13/2018 7:16 AM

## 2018-08-13 NOTE — Discharge Summary (Signed)
[facility] [place] [facility]   PATIENT NAME: Jasmine Simmons    MR#:  [id number]  DATE OF BIRTH:  [birth year]  DATE OF ADMISSION:  08/12/2018 ADMITTING PHYSICIAN: Arnaldo NatalMichael S Diamond, MD  DATE OF DISCHARGE: 08/13/2018  PRIMARY CARE PHYSICIAN: Patient, No Pcp Per    ADMISSION DIAGNOSIS:  QT prolongation [R94.31] Lithium toxicity, accidental or unintentional, initial encounter [T56.891A]  DISCHARGE DIAGNOSIS:  Active Problems:   Lithium toxicity   SECONDARY DIAGNOSIS:   Past Medical History:  Diagnosis Date  . Bipolar 1 disorder [facility](HCC)     HOSPITAL COURSE:   68 year old female with history of bipolar disorder on lithium who presented to the emergency room with nausea and vomiting.   1.  Lithium toxicity: Poison control was called and patient was monitored.  Lithium level has improved.   2.  Acute kidney injury due to nausea and vomiting which improved with IV fluids.  3.  Hypokalemia: This was repleted  4.  Bipolar: Patient may resume her outpatient medications once we have listed medications.      DISCHARGE CONDITIONS AND DIET:  Regular diet  CONSULTS OBTAINED:  Treatment Team:  Lamont DowdyKolluru, Sarath, MD  DRUG ALLERGIES:  No Known Allergies  DISCHARGE MEDICATIONS:   Allergies as of 08/13/2018   No Known Allergies     Medication List    TAKE these medications   levothyroxine 100 MCG tablet Commonly known as:  SYNTHROID, LEVOTHROID Take 100 mcg by mouth daily before breakfast.   lithium carbonate 300 MG capsule Take 3 capsules (900 mg total) by mouth 2 (two) times daily with a meal. What changed:    how much to take  when to take this   LORazepam 1 MG tablet Commonly known as:  ATIVAN Take 1 mg by mouth every 8 (eight) hours.   mirtazapine 15 MG tablet Commonly known as:  REMERON Take 15 mg by mouth at bedtime.   sertraline 100 MG tablet Commonly known as:  ZOLOFT Take 100 mg by mouth daily.   sitaGLIPtin-metformin  50-1000 MG tablet Commonly known as:  JANUMET Take 1 tablet by mouth 2 (two) times daily with a meal.         Today   CHIEF COMPLAINT:   Some weakness no tremors    VITAL SIGNS:  Blood pressure 136/60, pulse 77, temperature 98.2 F (36.8 C), temperature source Oral, resp. rate 16, height 5\' 4"  (1.626 m), weight 83 kg, SpO2 95 %.   REVIEW OF SYSTEMS:  Review of Systems  Constitutional: Negative.  Negative for chills, fever and malaise/fatigue.  HENT: Negative.  Negative for ear discharge, ear pain, hearing loss, nosebleeds and sore throat.   Eyes: Negative.  Negative for blurred vision and pain.  Respiratory: Negative.  Negative for cough, hemoptysis, shortness of breath and wheezing.   Cardiovascular: Negative.  Negative for chest pain, palpitations and leg swelling.  Gastrointestinal: Negative.  Negative for abdominal pain, blood in stool, diarrhea, nausea and vomiting.  Genitourinary: Negative.  Negative for dysuria.  Musculoskeletal: Negative.  Negative for back pain.  Skin: Negative.   Neurological: Negative for dizziness, tremors, speech change, focal weakness, seizures and headaches.  Endo/Heme/Allergies: Negative.  Does not bruise/bleed easily.  Psychiatric/Behavioral: Negative.  Negative for depression, hallucinations and suicidal ideas.    PHYSICAL EXAMINATION:  GENERAL:  68 y.o.-year-old patient lying in the bed with no acute distress.  NECK:  Supple, no jugular venous distention. No thyroid enlargement, no tenderness.  LUNGS: Normal breath sounds bilaterally, no  wheezing, rales,rhonchi  No use of accessory muscles of respiration.  CARDIOVASCULAR: S1, S2 normal. No murmurs, rubs, or gallops.  ABDOMEN: Soft, non-tender, non-distended. Bowel sounds present. No organomegaly or mass.  EXTREMITIES: No pedal edema, cyanosis, or clubbing.  PSYCHIATRIC: The patient is alert and oriented x 3.  SKIN: No obvious rash, lesion, or ulcer.   DATA REVIEW:   CBC Recent  Labs  Lab 08/12/18 0031  WBC 17.4*  HGB 12.7  HCT 37.4  PLT 371    Chemistries  Recent Labs  Lab 08/12/18 0809 08/12/18 1244  08/13/18 0425  NA 135 135  --  137  K 3.0* 3.3*   < > 3.3*  CL 109 109  --  115*  CO2 19* 21*  --  18*  GLUCOSE 225* 214*  --  197*  BUN 14 13  --  11  CREATININE 1.23* 1.17*  --  1.06*  CALCIUM 7.9* 7.8*  --  7.3*  MG 2.7* 2.7*  --   --   AST 21  --   --   --   ALT 15  --   --   --   ALKPHOS 155*  --   --   --   BILITOT 0.7  --   --   --    < > = values in this interval not displayed.    Cardiac Enzymes Recent Labs  Lab 08/12/18 0031  TROPONINI <0.03    Microbiology Results  @MICRORSLT48 @  RADIOLOGY:  No results found.    Allergies as of 08/13/2018   No Known Allergies     Medication List    TAKE these medications   levothyroxine 100 MCG tablet Commonly known as:  SYNTHROID, LEVOTHROID Take 100 mcg by mouth daily before breakfast.   lithium carbonate 300 MG capsule Take 3 capsules (900 mg total) by mouth 2 (two) times daily with a meal. What changed:    how much to take  when to take this   LORazepam 1 MG tablet Commonly known as:  ATIVAN Take 1 mg by mouth every 8 (eight) hours.   mirtazapine 15 MG tablet Commonly known as:  REMERON Take 15 mg by mouth at bedtime.   sertraline 100 MG tablet Commonly known as:  ZOLOFT Take 100 mg by mouth daily.   sitaGLIPtin-metformin 50-1000 MG tablet Commonly known as:  JANUMET Take 1 tablet by mouth 2 (two) times daily with a meal.         Management plans discussed with the patient and she is in agreement. Stable for discharge   Patient should follow up with pcp  CODE STATUS:     Code Status Orders  (From admission, onward)         Start     Ordered   08/12/18 0331  Full code  Continuous     08/12/18 0330        Code Status History    This patient has a current code status but no historical code status.      TOTAL TIME TAKING CARE OF THIS PATIENT:  38 minutes.    Note: This dictation was prepared with Dragon dictation along with smaller phrase technology. Any transcriptional errors that result from this process are unintentional.  Kaio Kuhlman M.D on 08/14/2018 at 8:17 AM  Between 7am to 6pm - Pager - [phone] After 6pm go to [email or web address] - password [email or web address]  [facility]  Office  [phone]  CC: Primary care physician; Patient, No  Pcp Per

## 2018-08-13 NOTE — Clinical Social Work Note (Signed)
The CSW received a consult that PT is recommending SNF. The patient does not have a payor source. The CSW has advised the attending MD to consult RNCM for possible charity care [facility]. CSW is signing off. Please consult should needs arise or if patient can private pay for SNF.  Argentina Ponder, MSW, Theresia Majors [phone]

## 2018-08-13 NOTE — Progress Notes (Signed)
Patient ID: Jasmine Simmons, female   DOB: 06/26/1951, 68 y.o.   MRN: [id number] Discharge med rec done per Dr Camillia Herter request

## 2018-08-13 NOTE — Care Management Note (Signed)
Case Management Note  Patient Details  Name: Mattisyn Denherder MRN: [id number] Date of Birth: 01/06/1951  Subjective/Objective:      Patient is from home alone.  A resident of [place]; she lives here for 6 mos out of the year.   Admitted with lithium overdose.  She states she takes lithium for bipolar disorder.   She states it was not an intentional overdose to this RN.  She is independent, drives, etc.  She has a PCP in [place] and she gets her medications there for the 35mos she is here in [place].  She has several friends that will help her at home and is declining [facility] services after being asked twice.  Referral to [facility] for rolling walker and accepted.  Notified patient's RN.  No further needs at this time.              Action/Plan:   Expected Discharge Date:  08/13/18               Expected Discharge Plan:  Home/Self Care  In-House Referral:     Discharge planning Services  CM Consult  Post Acute Care Choice:    Choice offered to:     DME Arranged:  Walker rolling DME Agency:  [facility]  HH Arranged:  Patient Refused [facility] HH Agency:     Status of Service:  Completed, signed off  If discussed at [employer] of Stay Meetings, dates discussed:    Additional Comments:  Sherren Kerns, RN 08/13/2018, 12:14 PM

## 2018-08-13 NOTE — Progress Notes (Signed)
MD called. The patient is having "horrible dreams" MD notified and recommended allow her to get up on the chair and stay awake. No medications ordered at this time.

## 2018-08-13 NOTE — Progress Notes (Signed)
[facility]  ROUNDING NOTE   Subjective:   Creatinine 1.06  Lithium 1.24 (1.32)  Objective:  Vital signs in last 24 hours:  Temp:  [97.5 F (36.4 C)-97.6 F (36.4 C)] 97.6 F (36.4 C) (01/25 0521) Pulse Rate:  [70-74] 74 (01/25 0521) Resp:  [20] 20 (01/25 0521) BP: (111-126)/(63-68) 126/68 (01/25 0521) SpO2:  [96 %-97 %] 96 % (01/25 0521) Weight:  [83 kg] 83 kg (01/25 0443)  Weight change: 28.6 kg Filed Weights   08/12/18 0024 08/12/18 0500 08/13/18 0443  Weight: 54.4 kg 82.9 kg 83 kg    Intake/Output: I/O last 3 completed shifts: In: 2736.8 [P.O.:200; I.V.:1987.2; IV Piggyback:549.5] Out: 2800 [Urine:2800]   Intake/Output this shift:  Total I/O In: 120 [P.O.:120] Out: 200 [Urine:200]  Physical Exam: General: NAD,   Head: Normocephalic, atraumatic. Moist oral mucosal membranes  Eyes: Anicteric, PERRL  Neck: Supple, trachea midline  Lungs:  Clear to auscultation  Heart: Regular rate and rhythm  Abdomen:  Soft, nontender,   Extremities:  no peripheral edema.  Neurologic: Nonfocal, moving all four extremities  Skin: No lesions        Basic Metabolic Panel: Recent Labs  Lab 08/12/18 0031 08/12/18 0809 08/12/18 1244 08/12/18 1749 08/13/18 0425  NA 134* 135 135  --  137  K 2.4* 3.0* 3.3* 3.7 3.3*  CL 101 109 109  --  115*  CO2 23 19* 21*  --  18*  GLUCOSE 225* 225* 214*  --  197*  BUN 14 14 13   --  11  CREATININE 1.35* 1.23* 1.17*  --  1.06*  CALCIUM 9.0 7.9* 7.8*  --  7.3*  MG  --  2.7* 2.7*  --   --   PHOS  --  1.9* 1.9*  --  2.4*    Liver Function Tests: Recent Labs  Lab 08/12/18 0031 08/12/18 0809  AST 23 21  ALT 18 15  ALKPHOS 193* 155*  BILITOT 0.8 0.7  PROT 7.7 6.4*  ALBUMIN 4.1 3.3*   Recent Labs  Lab 08/12/18 0031  LIPASE 55*   No results for input(s): AMMONIA in the last 168 hours.  CBC: Recent Labs  Lab 08/12/18 0031  WBC 17.4*  NEUTROABS 13.4*  HGB 12.7  HCT 37.4  MCV 90.6  PLT 371    Cardiac  Enzymes: Recent Labs  Lab 08/12/18 0031  TROPONINI <0.03    BNP: Invalid input(s): POCBNP  CBG: No results for input(s): GLUCAP in the last 168 hours.  Microbiology: No results found for this or any previous visit.  Coagulation Studies: No results for input(s): LABPROT, INR in the last 72 hours.  Urinalysis: Recent Labs    08/12/18 0504  COLORURINE YELLOW*  LABSPEC 1.011  PHURINE 6.0  GLUCOSEU NEGATIVE  HGBUR NEGATIVE  BILIRUBINUR NEGATIVE  KETONESUR NEGATIVE  PROTEINUR NEGATIVE  NITRITE NEGATIVE  LEUKOCYTESUR NEGATIVE      Imaging: Dg Chest 2 View  Result Date: 08/12/2018 CLINICAL DATA:  Flu like symptoms and body aches. EXAM: CHEST - 2 VIEW COMPARISON:  None. FINDINGS: The heart size and mediastinal contours are within normal limits. Both lungs are clear. The visualized skeletal structures are unremarkable. IMPRESSION: No active cardiopulmonary disease. Electronically Signed   By: Tollie Ethavid  Kwon M.D.   On: 08/12/2018 01:00     Medications:   . sodium chloride 100 mL/hr at 08/13/18 0445   . docusate sodium  100 mg Oral BID  . enoxaparin (LOVENOX) injection  40 mg Subcutaneous Q24H  .  phosphorus  500 mg Oral Q4H   acetaminophen **OR** acetaminophen, ondansetron (ZOFRAN) IV, zolpidem  Assessment/ Plan:  [name] is a 68 y.o. white female with bipolar disorder, who was admitted to [facility] on 08/12/2018 for QT prolongation [R94.31] Lithium toxicity, accidental or unintentional, initial encounter [T56.891A]  1. Acute renal failure with unknown baseline 2. Lithium toxicity. Normal range is 0.8-1.2 3. Hyponatremia  Continue IV fluids. Monitor lithium levels. Sodium back within normal limits.     LOS: 1 Thorne Wirz 1/25/202011:28 AM

## 2018-08-13 NOTE — Progress Notes (Signed)
The Patient has been discharged. Refused home health services.

## 2018-08-13 NOTE — Progress Notes (Addendum)
[facility] [facility] [place] [facility]   PATIENT NAME: Jasmine Simmons    MR#:  [id number]  DATE OF BIRTH:  April 20, 1951  SUBJECTIVE:   Patient presented to the emergency room with nausea and vomiting and found to have elevated lithium level.   REVIEW OF SYSTEMS:    Review of Systems  Constitutional: Negative for fever, chills weight loss HENT: Negative for ear pain, nosebleeds, congestion, facial swelling, rhinorrhea, neck pain, neck stiffness and ear discharge.   Respiratory: Negative for cough, shortness of breath, wheezing  Cardiovascular: Negative for chest pain, palpitations and leg swelling.  Gastrointestinal: Negative for heartburn, abdominal pain, vomiting, diarrhea or consitpation Genitourinary: Negative for dysuria, urgency, frequency, hematuria Musculoskeletal: Negative for back pain or joint pain Neurological: Negative for dizziness, seizures, syncope, focal weakness,  numbness and headaches.  Hematological: Does not bruise/bleed easily.  Psychiatric/Behavioral: Negative for hallucinations, confusion, dysphoric mood    Tolerating Diet: yes      DRUG ALLERGIES:  No Known Allergies  VITALS:  Blood pressure 126/68, pulse 74, temperature 97.6 F (36.4 C), temperature source Oral, resp. rate 20, height 5\' 4"  (1.626 m), weight 83 kg, SpO2 96 %.  PHYSICAL EXAMINATION:  Constitutional: Appears well-developed and well-nourished. No distress. HENT: Normocephalic. Marland Kitchen. Oropharynx is clear and moist.  Eyes: Conjunctivae and EOM are normal. PERRLA, no scleral icterus.  Neck: Normal ROM. Neck supple. No JVD. No tracheal deviation. CVS: RRR, S1/S2 +, no murmurs, no gallops, no carotid bruit.  Pulmonary: Effort and breath sounds normal, no stridor, rhonchi, wheezes, rales.  Abdominal: Soft. BS +,  no distension, tenderness, rebound or guarding.  Musculoskeletal: Normal range of motion. No edema and no tenderness.  Neuro: Alert. CN 2-12 grossly intact. No focal  deficits. Skin: Skin is warm and dry. No rash noted. Psychiatric: Normal mood and affect.      LABORATORY PANEL:   CBC Recent Labs  Lab 08/12/18 0031  WBC 17.4*  HGB 12.7  HCT 37.4  PLT 371   ------------------------------------------------------------------------------------------------------------------  Chemistries  Recent Labs  Lab 08/12/18 0809 08/12/18 1244  08/13/18 0425  NA 135 135  --  137  K 3.0* 3.3*   < > 3.3*  CL 109 109  --  115*  CO2 19* 21*  --  18*  GLUCOSE 225* 214*  --  197*  BUN 14 13  --  11  CREATININE 1.23* 1.17*  --  1.06*  CALCIUM 7.9* 7.8*  --  7.3*  MG 2.7* 2.7*  --   --   AST 21  --   --   --   ALT 15  --   --   --   ALKPHOS 155*  --   --   --   BILITOT 0.7  --   --   --    < > = values in this interval not displayed.   ------------------------------------------------------------------------------------------------------------------  Cardiac Enzymes Recent Labs  Lab 08/12/18 0031  TROPONINI <0.03   ------------------------------------------------------------------------------------------------------------------  RADIOLOGY:  Dg Chest 2 View  Result Date: 08/12/2018 CLINICAL DATA:  Flu like symptoms and body aches. EXAM: CHEST - 2 VIEW COMPARISON:  None. FINDINGS: The heart size and mediastinal contours are within normal limits. Both lungs are clear. The visualized skeletal structures are unremarkable. IMPRESSION: No active cardiopulmonary disease. Electronically Signed   By: Tollie Ethavid  Kwon M.D.   On: 08/12/2018 01:00     ASSESSMENT AND PLAN:   68 year old female with history of bipolar disorder on lithium who presented to  the emergency room with nausea and vomiting.   1.  Lithium toxicity: Poison control was called and patient was monitored.  Lithium level has improved.   2.  Acute kidney injury due to nausea and vomiting which improved with IV fluids.  3.  Hypokalemia: This was repleted  4.  Bipolar: Patient may resume her  outpatient medications once we have listed medications.   Management plans discussed with the patient and she is in agreement.  CODE STATUS: Full  TOTAL TIME TAKING CARE OF THIS PATIENT:30 minutes.     POSSIBLE D/C today, DEPENDING ON CLINICAL CONDITION.   Fate Caster M.D on 08/13/2018 at 10:45 AM  Between 7am to 6pm - Pager - [phone] After 6pm go to [email or web address] - password [email or web address]  [facility]  Office  [phone]  CC: Primary care physician; Patient, No Pcp Per  Note: This dictation was prepared with Dragon dictation along with smaller phrase technology. Any transcriptional errors that result from this process are unintentional.

## 2018-08-13 NOTE — Evaluation (Signed)
Physical Therapy Evaluation Patient Details Name: Jasmine Simmons MRN: [id number] DOB: Nov 04, 1950 Today's Date: 08/13/2018   History of Present Illness  68 yo female with lithium toxicity was admitted, noted AKI with low K+, low Na+ from vomiting, leukocytosis, and was at risk for sepsis.  Had prolonged QT interval at admission.  PMHx:  cesarian section, bipolar disorder, recent viral illness,   Clinical Impression  Pt is lethargic, unfocused on details with PT today.  Her concerns for home dc are that she is unable to give a history, unable to give her approval to walk alone and cannot take her on stairs in her current state of alertness to validate safety.  Progress with acute therapy to more independent walking with RW, then to stairs and progress with balance to allow pt to transition home, which will likely not happen until dc to SNF.    Follow Up Recommendations SNF    Equipment Recommendations  Rolling walker with 5" wheels    Recommendations for Other Services       Precautions / Restrictions Precautions Precautions: Fall Restrictions Weight Bearing Restrictions: No      Mobility  Bed Mobility Overal bed mobility: Needs Assistance Bed Mobility: Supine to Sit;Sit to Supine     Supine to sit: Supervision Sit to supine: Supervision   General bed mobility comments: safety is reason for supervising  Transfers Overall transfer level: Needs assistance Equipment used: 1 person hand held assist(IV pole) Transfers: Sit to/from Stand Sit to Stand: Min assist            Ambulation/Gait Ambulation/Gait assistance: Min assist;Min guard Gait Distance (Feet): 70 Feet Assistive device: IV Pole Gait Pattern/deviations: Step-through pattern;Step-to pattern;Decreased stride length;Narrow base of support;Drifts right/left Gait velocity: reduced Gait velocity interpretation: <1.31 ft/sec, indicative of household ambulator General Gait Details: pt is minimally controlling IV  pole, needs occas steadying with gait belt and hand on IV pole  Stairs Stairs: (deferred)          Wheelchair Mobility    Modified Rankin (Stroke Patients Only)       Balance Overall balance assessment: Needs assistance Sitting-balance support: Feet supported Sitting balance-Leahy Scale: Fair     Standing balance support: Bilateral upper extremity supported;During functional activity Standing balance-Leahy Scale: Poor                               Pertinent Vitals/Pain Pain Assessment: No/denies pain    Home Living Family/patient expects to be discharged to:: Private residence Living Arrangements: Alone Available Help at Discharge: Family;Available PRN/intermittently Type of Home: House Home Access: Stairs to enter Entrance Stairs-Rails: Doctor, general practice of Steps: 3 Home Layout: One level Home Equipment: None Additional Comments: pt is lethargic and may be giving unreliable details    Prior Function Level of Independence: Independent               Hand Dominance   Dominant Hand: Right    Extremity/Trunk Assessment   Upper Extremity Assessment Upper Extremity Assessment: Overall WFL for tasks assessed    Lower Extremity Assessment Lower Extremity Assessment: Generalized weakness    Cervical / Trunk Assessment Cervical / Trunk Assessment: Normal  Communication   Communication: No difficulties  Cognition Arousal/Alertness: Lethargic Behavior During Therapy: Flat affect Overall Cognitive Status: No family/caregiver present to determine baseline cognitive functioning  General Comments: pt is slow to respond to commands and unsteady on her feet, unsafe to walk      General Comments General comments (skin integrity, edema, etc.): lethargic presentation is the main reason for her safety concerns, as she is losing focus of the task at  hand, falls asleep suddenly during the  session and is unable to follow through on history details    Exercises     Assessment/Plan    PT Assessment Patient needs continued PT services  PT Problem List Decreased strength;Decreased range of motion;Decreased activity tolerance;Decreased balance;Decreased mobility;Decreased coordination;Decreased cognition;Decreased knowledge of use of DME;Decreased safety awareness       PT Treatment Interventions DME instruction;Gait training;Stair training;Functional mobility training;Therapeutic activities;Therapeutic exercise;Balance training;Neuromuscular re-education;Patient/family education    PT Goals (Current goals can be found in the Care Plan section)  Acute Rehab PT Goals Patient Stated Goal: none stated PT Goal Formulation: Patient unable to participate in goal setting Time For Goal Achievement: 08/27/18 Potential to Achieve Goals: Good    Frequency Min 2X/week   Barriers to discharge Inaccessible home environment;Decreased caregiver support home alone with stairs to enter house    Co-evaluation               AM-PAC PT "6 Clicks" Mobility  Outcome Measure Help needed turning from your back to your side while in a flat bed without using bedrails?: None Help needed moving from lying on your back to sitting on the side of a flat bed without using bedrails?: A Little Help needed moving to and from a bed to a chair (including a wheelchair)?: A Little Help needed standing up from a chair using your arms (e.g., wheelchair or bedside chair)?: A Little Help needed to walk in hospital room?: A Lot Help needed climbing 3-5 steps with a railing? : Total 6 Click Score: 16    End of Session Equipment Utilized During Treatment: Gait belt Activity Tolerance: Patient limited by lethargy;Treatment limited secondary to medical complications (Comment)(ongoing medication changes wiht new tremor) Patient left: in bed;with call bell/phone within reach;with bed alarm set Nurse  Communication: Mobility status;Other (comment)(dc expectations) PT Visit Diagnosis: Unsteadiness on feet (R26.81);Muscle weakness (generalized) (M62.81);Difficulty in walking, not elsewhere classified (R26.2)    Time: 1035-1050 PT Time Calculation (min) (ACUTE ONLY): 15 min   Charges:   PT Evaluation $PT Eval Moderate Complexity: 1 Mod         Ivar Drape 08/13/2018, 11:20 AM  Samul Dada, PT MS Acute Rehab Dept. Number: [facility] [id number] and [facility] [phone]

## 2020-07-13 ENCOUNTER — Other Ambulatory Visit: Payer: Self-pay

## 2020-07-13 ENCOUNTER — Inpatient Hospital Stay
Admission: EM | Admit: 2020-07-13 | Discharge: 2020-07-19 | DRG: 683 | Disposition: A | Discharge status: Home / Self Care | Payer: Self-pay | Attending: Hospitalist | Admitting: Hospitalist

## 2020-07-13 DIAGNOSIS — E860 Dehydration: Secondary | ICD-10-CM | POA: Diagnosis present

## 2020-07-13 DIAGNOSIS — E871 Hypo-osmolality and hyponatremia: Secondary | ICD-10-CM | POA: Diagnosis present

## 2020-07-13 DIAGNOSIS — E872 Acidosis: Secondary | ICD-10-CM | POA: Diagnosis present

## 2020-07-13 DIAGNOSIS — E039 Hypothyroidism, unspecified: Secondary | ICD-10-CM | POA: Diagnosis present

## 2020-07-13 DIAGNOSIS — R251 Tremor, unspecified: Secondary | ICD-10-CM | POA: Diagnosis present

## 2020-07-13 DIAGNOSIS — T56891A Toxic effect of other metals, accidental (unintentional), initial encounter: Secondary | ICD-10-CM

## 2020-07-13 DIAGNOSIS — E861 Hypovolemia: Secondary | ICD-10-CM | POA: Diagnosis present

## 2020-07-13 DIAGNOSIS — E119 Type 2 diabetes mellitus without complications: Secondary | ICD-10-CM | POA: Diagnosis present

## 2020-07-13 DIAGNOSIS — T43595A Adverse effect of other antipsychotics and neuroleptics, initial encounter: Secondary | ICD-10-CM | POA: Diagnosis present

## 2020-07-13 DIAGNOSIS — N179 Acute kidney failure, unspecified: Principal | ICD-10-CM

## 2020-07-13 DIAGNOSIS — Z79890 Hormone replacement therapy (postmenopausal): Secondary | ICD-10-CM

## 2020-07-13 DIAGNOSIS — K529 Noninfective gastroenteritis and colitis, unspecified: Secondary | ICD-10-CM | POA: Diagnosis present

## 2020-07-13 DIAGNOSIS — E8729 Other acidosis: Secondary | ICD-10-CM

## 2020-07-13 DIAGNOSIS — F319 Bipolar disorder, unspecified: Secondary | ICD-10-CM | POA: Diagnosis present

## 2020-07-13 DIAGNOSIS — Z20822 Contact with and (suspected) exposure to covid-19: Secondary | ICD-10-CM | POA: Diagnosis present

## 2020-07-13 DIAGNOSIS — E876 Hypokalemia: Secondary | ICD-10-CM | POA: Diagnosis present

## 2020-07-13 LAB — COMPREHENSIVE METABOLIC PANEL
ALT: 17 U/L (ref 0–44)
AST: 12 U/L — ABNORMAL LOW (ref 15–41)
Albumin: 4.4 g/dL (ref 3.5–5.0)
Alkaline Phosphatase: 150 U/L — ABNORMAL HIGH (ref 38–126)
Anion gap: 18 — ABNORMAL HIGH (ref 5–15)
BUN: 78 mg/dL — ABNORMAL HIGH (ref 8–23)
CO2: 14 mmol/L — ABNORMAL LOW (ref 22–32)
Calcium: 9.5 mg/dL (ref 8.9–10.3)
Chloride: 98 mmol/L (ref 98–111)
Creatinine, Ser: 6.53 mg/dL — ABNORMAL HIGH (ref 0.44–1.00)
GFR, Estimated: 6 mL/min — ABNORMAL LOW (ref >60)
Glucose, Bld: 167 mg/dL — ABNORMAL HIGH (ref 70–99)
Potassium: 3.5 mmol/L (ref 3.5–5.1)
Sodium: 130 mmol/L — ABNORMAL LOW (ref 135–145)
Total Bilirubin: 0.9 mg/dL (ref 0.3–1.2)
Total Protein: 8.4 g/dL — ABNORMAL HIGH (ref 6.5–8.1)

## 2020-07-13 LAB — CBC WITH DIFFERENTIAL/PLATELET
Abs Immature Granulocytes: 0.07 10*3/uL (ref 0.00–0.07)
Basophils Absolute: 0.1 10*3/uL (ref 0.0–0.1)
Basophils Relative: 0 %
Eosinophils Absolute: 0.2 10*3/uL (ref 0.0–0.5)
Eosinophils Relative: 1 %
HCT: 38.2 % (ref 36.0–46.0)
Hemoglobin: 13 g/dL (ref 12.0–15.0)
Immature Granulocytes: 1 %
Lymphocytes Relative: 16 %
Lymphs Abs: 2.3 10*3/uL (ref 0.7–4.0)
MCH: 31.8 pg (ref 26.0–34.0)
MCHC: 34 g/dL (ref 30.0–36.0)
MCV: 93.4 fL (ref 80.0–100.0)
Monocytes Absolute: 1 10*3/uL (ref 0.1–1.0)
Monocytes Relative: 8 %
Neutro Abs: 10.2 10*3/uL — ABNORMAL HIGH (ref 1.7–7.7)
Neutrophils Relative %: 74 %
Platelets: 341 10*3/uL (ref 150–400)
RBC: 4.09 MIL/uL (ref 3.87–5.11)
RDW: 12.9 % (ref 11.5–15.5)
WBC: 13.8 10*3/uL — ABNORMAL HIGH (ref 4.0–10.5)
nRBC: 0 % (ref 0.0–0.2)

## 2020-07-13 LAB — LITHIUM LEVEL: Lithium Lvl: 1.74 mmol/L (ref 0.60–1.20)

## 2020-07-13 NOTE — ED Notes (Signed)
Date and time results received: 07/13/20 2337 (use smartphrase ".now" to insert current time)  Test: Lithium Critical Value: 1.74  Name of Provider Notified: Dr. York Cerise  Orders Received? Or Actions Taken?: provider notified.

## 2020-07-13 NOTE — ED Triage Notes (Signed)
Pt states she has been shaky for a week and thinks it could be her lithium levels, as it has happened in the past of them being too high. Pt alert and talking with friend in triage room. Pt states not taking any extra doses of her lithium medication, but stopped taking her lithium this morning. Pt states some diarrhea and nausea.

## 2020-07-13 NOTE — ED Notes (Signed)
EKG given to provider for review

## 2020-07-14 ENCOUNTER — Inpatient Hospital Stay: Payer: Self-pay

## 2020-07-14 DIAGNOSIS — E039 Hypothyroidism, unspecified: Secondary | ICD-10-CM

## 2020-07-14 DIAGNOSIS — N179 Acute kidney failure, unspecified: Secondary | ICD-10-CM

## 2020-07-14 DIAGNOSIS — E872 Acidosis: Secondary | ICD-10-CM

## 2020-07-14 DIAGNOSIS — F319 Bipolar disorder, unspecified: Secondary | ICD-10-CM

## 2020-07-14 DIAGNOSIS — E8729 Other acidosis: Secondary | ICD-10-CM

## 2020-07-14 LAB — CBC
HCT: 36.6 % (ref 36.0–46.0)
HCT: 32.7 % — ABNORMAL LOW (ref 36.0–46.0)
Hemoglobin: 12.5 g/dL (ref 12.0–15.0)
Hemoglobin: 11 g/dL — ABNORMAL LOW (ref 12.0–15.0)
MCH: 32.3 pg (ref 26.0–34.0)
MCH: 32.1 pg (ref 26.0–34.0)
MCHC: 34.2 g/dL (ref 30.0–36.0)
MCHC: 33.6 g/dL (ref 30.0–36.0)
MCV: 94.6 fL (ref 80.0–100.0)
MCV: 95.3 fL (ref 80.0–100.0)
Platelets: 295 10*3/uL (ref 150–400)
Platelets: 255 10*3/uL (ref 150–400)
RBC: 3.87 MIL/uL (ref 3.87–5.11)
RBC: 3.43 MIL/uL — ABNORMAL LOW (ref 3.87–5.11)
RDW: 13 % (ref 11.5–15.5)
RDW: 12.9 % (ref 11.5–15.5)
WBC: 11.2 10*3/uL — ABNORMAL HIGH (ref 4.0–10.5)
WBC: 10.3 10*3/uL (ref 4.0–10.5)
nRBC: 0 % (ref 0.0–0.2)
nRBC: 0 % (ref 0.0–0.2)

## 2020-07-14 LAB — CBG MONITORING, ED
Glucose-Capillary: 126 mg/dL — ABNORMAL HIGH (ref 70–99)
Glucose-Capillary: 154 mg/dL — ABNORMAL HIGH (ref 70–99)
Glucose-Capillary: 159 mg/dL — ABNORMAL HIGH (ref 70–99)

## 2020-07-14 LAB — URINALYSIS, COMPLETE (UACMP) WITH MICROSCOPIC
Bilirubin Urine: NEGATIVE
Glucose, UA: NEGATIVE mg/dL
Ketones, ur: NEGATIVE mg/dL
Leukocytes,Ua: NEGATIVE
Nitrite: NEGATIVE
Protein, ur: 100 mg/dL — AB
Specific Gravity, Urine: 1.01 (ref 1.005–1.030)
pH: 5 (ref 5.0–8.0)

## 2020-07-14 LAB — BASIC METABOLIC PANEL
Anion gap: 14 (ref 5–15)
Anion gap: 11 (ref 5–15)
BUN: 77 mg/dL — ABNORMAL HIGH (ref 8–23)
BUN: 74 mg/dL — ABNORMAL HIGH (ref 8–23)
CO2: 17 mmol/L — ABNORMAL LOW (ref 22–32)
CO2: 16 mmol/L — ABNORMAL LOW (ref 22–32)
Calcium: 9 mg/dL (ref 8.9–10.3)
Calcium: 8.1 mg/dL — ABNORMAL LOW (ref 8.9–10.3)
Chloride: 100 mmol/L (ref 98–111)
Chloride: 106 mmol/L (ref 98–111)
Creatinine, Ser: 6.23 mg/dL — ABNORMAL HIGH (ref 0.44–1.00)
Creatinine, Ser: 5.97 mg/dL — ABNORMAL HIGH (ref 0.44–1.00)
GFR, Estimated: 7 mL/min — ABNORMAL LOW (ref >60)
GFR, Estimated: 7 mL/min — ABNORMAL LOW (ref >60)
Glucose, Bld: 128 mg/dL — ABNORMAL HIGH (ref 70–99)
Glucose, Bld: 137 mg/dL — ABNORMAL HIGH (ref 70–99)
Potassium: 3.2 mmol/L — ABNORMAL LOW (ref 3.5–5.1)
Potassium: 3.2 mmol/L — ABNORMAL LOW (ref 3.5–5.1)
Sodium: 131 mmol/L — ABNORMAL LOW (ref 135–145)
Sodium: 133 mmol/L — ABNORMAL LOW (ref 135–145)

## 2020-07-14 LAB — LITHIUM LEVEL
Lithium Lvl: 1.59 mmol/L (ref 0.60–1.20)
Lithium Lvl: 1.36 mmol/L — ABNORMAL HIGH (ref 0.60–1.20)
Lithium Lvl: 1.16 mmol/L (ref 0.60–1.20)

## 2020-07-14 LAB — TSH: TSH: 1.65 u[IU]/mL (ref 0.350–4.500)

## 2020-07-14 LAB — RESP PANEL BY RT-PCR (FLU A&B, COVID) ARPGX2
Influenza A by PCR: NEGATIVE
Influenza B by PCR: NEGATIVE
SARS Coronavirus 2 by RT PCR: NEGATIVE

## 2020-07-14 LAB — HIV ANTIBODY (ROUTINE TESTING W REFLEX): HIV Screen 4th Generation wRfx: NONREACTIVE

## 2020-07-14 LAB — HEMOGLOBIN A1C
Hgb A1c MFr Bld: 7.2 % — ABNORMAL HIGH (ref 4.8–5.6)
Mean Plasma Glucose: 159.94 mg/dL

## 2020-07-14 LAB — BETA-HYDROXYBUTYRIC ACID: Beta-Hydroxybutyric Acid: 1.13 mmol/L — ABNORMAL HIGH (ref 0.05–0.27)

## 2020-07-14 LAB — ACETAMINOPHEN LEVEL: Acetaminophen (Tylenol), Serum: <10 ug/mL — ABNORMAL LOW (ref 10–30)

## 2020-07-14 LAB — SALICYLATE LEVEL: Salicylate Lvl: <7 mg/dL — ABNORMAL LOW (ref 7.0–30.0)

## 2020-07-14 MED ORDER — HEPARIN SODIUM (PORCINE) 5000 UNIT/ML IJ SOLN: 5000.0000 [IU] | Freq: Three times a day (TID) | INTRAMUSCULAR | Status: DC

## 2020-07-14 MED ORDER — SODIUM CHLORIDE 0.9 % IV SOLN: INTRAVENOUS | Status: DC

## 2020-07-14 MED ORDER — ACETAMINOPHEN 325 MG PO TABS
650.0000 mg | ORAL_TABLET | Freq: Four times a day (QID) | ORAL | Status: DC | PRN
  Administered 2020-07-16: 650 mg via ORAL
  Filled 2020-07-14: qty 2

## 2020-07-14 MED ORDER — HEPARIN SODIUM (PORCINE) 5000 UNIT/ML IJ SOLN
5000.0000 [IU] | Freq: Three times a day (TID) | INTRAMUSCULAR | Status: DC
  Administered 2020-07-14 – 2020-07-18 (×15): 5000 [IU] via SUBCUTANEOUS
  Filled 2020-07-14 (×16): qty 1

## 2020-07-14 MED ORDER — ONDANSETRON HCL 4 MG PO TABS: 4.0000 mg | ORAL_TABLET | Freq: Four times a day (QID) | ORAL | Status: DC | PRN

## 2020-07-14 MED ORDER — INSULIN ASPART 100 UNIT/ML ~~LOC~~ SOLN
0.0000 [IU] | Freq: Three times a day (TID) | SUBCUTANEOUS | Status: DC
  Administered 2020-07-14 – 2020-07-16 (×2): 2 [IU] via SUBCUTANEOUS
  Administered 2020-07-16 (×2): 1 [IU] via SUBCUTANEOUS
  Administered 2020-07-17: 18:00:00 2 [IU] via SUBCUTANEOUS
  Administered 2020-07-17 – 2020-07-18 (×2): 1 [IU] via SUBCUTANEOUS
  Administered 2020-07-18: 13:00:00 2 [IU] via SUBCUTANEOUS
  Administered 2020-07-19: 1 [IU] via SUBCUTANEOUS
  Administered 2020-07-19: 2 [IU] via SUBCUTANEOUS
  Filled 2020-07-14 (×11): qty 1

## 2020-07-14 MED ORDER — SODIUM CHLORIDE 0.9 % IV BOLUS
1000.0000 mL | Freq: Once | INTRAVENOUS | Status: AC
  Administered 2020-07-14: 1000 mL via INTRAVENOUS

## 2020-07-14 MED ORDER — ACETAMINOPHEN 650 MG RE SUPP: 650.0000 mg | Freq: Four times a day (QID) | RECTAL | Status: DC | PRN

## 2020-07-14 MED ORDER — SODIUM CHLORIDE 0.9 % IV SOLN
1000.0000 mL | Freq: Once | INTRAVENOUS | Status: AC
  Administered 2020-07-14: 1000 mL via INTRAVENOUS

## 2020-07-14 MED ORDER — POTASSIUM CHLORIDE CRYS ER 20 MEQ PO TBCR
20.0000 meq | EXTENDED_RELEASE_TABLET | Freq: Two times a day (BID) | ORAL | Status: AC
  Administered 2020-07-14 – 2020-07-16 (×5): 20 meq via ORAL
  Filled 2020-07-14 (×5): qty 1

## 2020-07-14 MED ORDER — ONDANSETRON HCL 4 MG/2ML IJ SOLN
4.0000 mg | Freq: Four times a day (QID) | INTRAMUSCULAR | Status: DC | PRN
  Administered 2020-07-14: 16:00:00 4 mg via INTRAVENOUS
  Filled 2020-07-14: qty 2

## 2020-07-14 NOTE — ED Notes (Signed)
Patient denies pain and is resting comfortably.  

## 2020-07-14 NOTE — ED Notes (Signed)
Pt given cup of water as requested. Provider KG at bedside.

## 2020-07-14 NOTE — ED Notes (Signed)
Pt alert and calmly laying in bed. Bed locked low. Rail up. Call bell within reach. Pt in room located near nurses station.

## 2020-07-14 NOTE — ED Notes (Addendum)
Poison control nurse called back. Reviewed most recent VS, labs, symptoms. She recommended continue NS infusion at 125-279mL/hr and repeat serum lithium level q 6-8hr. Goal for lithium level is less than 1.5.  Contact is Rose, RN at [phone] ext [phone].

## 2020-07-14 NOTE — ED Notes (Signed)
Breakfast tray provided. 

## 2020-07-14 NOTE — ED Notes (Signed)
Pt denies pain but, when asked, admits that she is nauseous.

## 2020-07-14 NOTE — ED Notes (Addendum)
Called lab about missing lithium level result as this RN had sent at about 15:17. Lab states will run now.

## 2020-07-14 NOTE — ED Provider Notes (Signed)
[facility] Emergency Department Provider Note   ____________________________________________    I have reviewed the triage vital signs and the nursing notes.   HISTORY  Chief Complaint Shaking     HPI Jasmine Simmons is a 69 y.o. female with history of bipolar disorder who takes lithium for this who presents with complaints of tremors and feeling a slowness to her thinking.  Patient reports she has had this in the past and it was related to lithium toxicity.  Review of records demonstrates the patient was admitted on January 24 of last year with a lithium level of 1.93.  Patient denies numbness or weakness.  Does have intermittent full-body tremors.  Symptoms started yesterday.  No nausea or vomiting.  No chest pain  Past Medical History:  Diagnosis Date  . Bipolar 1 disorder [facility])     Patient Active Problem List   Diagnosis Date Noted  . AKI (acute kidney injury) (HCC) 07/14/2020  . High anion gap metabolic acidosis 07/14/2020  . Bipolar disorder, unspecified (HCC) 07/14/2020  . Lithium toxicity 08/12/2018    Past Surgical History:  Procedure Laterality Date  . CESAREAN SECTION      Prior to Admission medications   Medication Sig Start Date End Date Taking? Authorizing Provider  levothyroxine (SYNTHROID, LEVOTHROID) 100 MCG tablet Take 100 mcg by mouth daily before breakfast.    [provider]  lithium carbonate 300 MG capsule Take 3 capsules (900 mg total) by mouth 2 (two) times daily with a meal. 08/13/18   Enedina Finner, MD  LORazepam (ATIVAN) 1 MG tablet Take 1 mg by mouth every 8 (eight) hours.    [provider]  mirtazapine (REMERON) 15 MG tablet Take 15 mg by mouth at bedtime.    [provider]  sertraline (ZOLOFT) 100 MG tablet Take 100 mg by mouth daily. 09/14/12   [provider]  sitaGLIPtin-metformin (JANUMET) 50-1000 MG tablet Take 1 tablet by mouth 2 (two) times daily with a meal.    [provider]     Allergies Patient has no known allergies.  Family History  Family history unknown: Yes    Social History Social History   Tobacco Use  . Smoking status: Never Smoker  . Smokeless tobacco: Never Used  Vaping Use  . Vaping Use: Never used  Substance Use Topics  . Alcohol use: Never  . Drug use: Never    Review of Systems  Constitutional: No fever/chills Eyes: No visual changes.  ENT: No sore throat. Cardiovascular: Denies chest pain. Respiratory: Denies shortness of breath. Gastrointestinal: No abdominal pain.  No nausea, no vomiting.   Genitourinary: Negative for dysuria. Musculoskeletal: Negative for back pain. Skin: Negative for rash. Neurological: As above   ____________________________________________   PHYSICAL EXAM:  VITAL SIGNS: ED Triage Vitals  Enc Vitals Group     BP 07/13/20 2245 138/78     Pulse Rate 07/13/20 2245 93     Resp 07/13/20 2245 18     Temp 07/13/20 2245 98 F (36.7 C)     Temp Source 07/13/20 2245 Oral     SpO2 07/13/20 2245 98 %     Weight 07/13/20 2247 77.1 kg (170 lb)     Height 07/13/20 2247 1.626 m (5\' 4" )     Head Circumference --      Peak Flow --      Pain Score 07/13/20 2247 0     Pain Loc --  Pain Edu? --      Excl. in GC? --     Constitutional: Alert and oriented.  Responding appropriately to questions  Nose: No congestion/rhinnorhea. Mouth/Throat: Mucous membranes are moist.   Neck:  Painless ROM Cardiovascular: Normal rate, regular rhythm. Grossly normal heart sounds.  Good peripheral circulation. Respiratory: Normal respiratory effort.  No retractions. Lungs CTAB. Gastrointestinal: Soft and nontender. No distention.  No CVA tenderness.  Musculoskeletal:   Warm and well perfused Neurologic:  Normal speech and language. No gross focal neurologic deficits are appreciated.  No tremors at this time Skin:  Skin is warm, dry and intact. No rash noted. Psychiatric: Mood and affect are normal.  Speech and behavior are normal.  ____________________________________________   LABS (all labs ordered are listed, but only abnormal results are displayed)  Labs Reviewed  COMPREHENSIVE METABOLIC PANEL - Abnormal; Notable for the following components:      Result Value   Sodium 130 (*)    CO2 14 (*)    Glucose, Bld 167 (*)    BUN 78 (*)    Creatinine, Ser 6.53 (*)    Total Protein 8.4 (*)    AST 12 (*)    Alkaline Phosphatase 150 (*)    GFR, Estimated 6 (*)    Anion gap 18 (*)    All other components within normal limits  CBC WITH DIFFERENTIAL/PLATELET - Abnormal; Notable for the following components:   WBC 13.8 (*)    Neutro Abs 10.2 (*)    All other components within normal limits  LITHIUM LEVEL - Abnormal; Notable for the following components:   Lithium Lvl 1.74 (*)    All other components within normal limits  RESP PANEL BY RT-PCR (FLU A&B, COVID) ARPGX2  URINALYSIS, COMPLETE (UACMP) WITH MICROSCOPIC   ____________________________________________  EKG  ED ECG REPORT I, Jene Every, the attending physician, personally viewed and interpreted this ECG.  Date: 07/14/2020  Rhythm: normal sinus rhythm QRS Axis: normal Intervals: normal ST/T Wave abnormalities: Nonspecific changes Narrative Interpretation: no evidence of acute ischemia  ____________________________________________  RADIOLOGY  None ____________________________________________   PROCEDURES  Procedure(s) performed: No  Procedures   Critical Care performed: no ____________________________________________   INITIAL IMPRESSION / ASSESSMENT AND PLAN / ED COURSE  Pertinent labs & imaging results that were available during my care of the patient were reviewed by me and considered in my medical decision making (see chart for details).  Patient presents with complaints of sluggishness and intermittent tremors with concern for lithium toxicity, she reports the symptoms are similar to when she  had elevated lithium nearly a year ago.  As noted above medical record reviewed, patient admitted in January 2020 for lithium of 1.93  Overall patient is well-appearing, no significant neurological deficits.  Vital signs are reassuring.  Lab work is most notable for acute kidney injury with creatinine of 6.93.  Lithium of 1.74.  Possibility of AKI causing elevated lithium or perhaps chronic lithium nephrotoxicity  Does not meet criteria for dialysis based on findings, will start IV hydration admit to the hospital service    ____________________________________________   FINAL CLINICAL IMPRESSION(S) / ED DIAGNOSES  Final diagnoses:  Acute kidney injury (HCC)  Lithium toxicity, accidental or unintentional, initial encounter        Note:  This document was prepared using Dragon voice recognition software and may include unintentional dictation errors.   Jene Every, MD 07/14/20 [phone]

## 2020-07-14 NOTE — ED Notes (Signed)
This RN spoke with poison control for recommendations per providers request. Poison control recommends at this time IV fluids, 0.9% NS is the fluid of choice. Poison control is reaching out to Toxicologist for more recommendations at this time. Dr. Cyril Loosen made aware.

## 2020-07-14 NOTE — H&P (Signed)
History and Physical    Jasmine Simmons FBP:[id number] DOB: January 21, 1951 DOA: 07/13/2020  PCP: Patient, No Pcp Per   Patient coming from: home  I have personally briefly reviewed patient's old medical records in [facility]  Chief Complaint: Tremors, slowed thinking, history of lithium toxicity  HPI: Jasmine Simmons is a 69 y.o. female with medical history significant for hypothyroidism, chronic diarrhea and bipolar disorder on lithium with history of hospitalization in 2020 lithium toxicity who presents to the ER with a complaint of slowed thinking/brain fog and tremors similar to when she had lithium toxicity a year ago.  Symptoms started a week ago.  She denies taking more lithium than her prescribed dose.  Had an episode of vomiting 2 days prior.  Patient reports a 1 year history of diarrhea of up to 5 bowel movements daily.  It has not worsened recently.  States she i was referred to see a specialist to address the problem .denies recent antibiotic use ED Course: On arrival, afebrile, BP 138/78, pulse 93 O2 sat 98% on room air.  Blood work significant for lithium level of 1.74.  With creatinine 6.53 above most recent baseline a year ago of 1.06, bicarb of 14 and anion gap 18.  Mild elevation in WBC to 13,000 urinalysis with many bacteria but no nitrites or leukocyte esterase. EKG as reviewed by me : Normal sinus rhythm at 91 with mild QTC prolongation of 464  Patient given an IV fluid bolus.  Hospitalist consulted for admission.   Review of Systems: As per HPI otherwise all other systems on review of systems negative.    Past Medical History:  Diagnosis Date  . Bipolar 1 disorder [facility])     Past Surgical History:  Procedure Laterality Date  . CESAREAN SECTION       reports that she has never smoked. She has never used smokeless tobacco. She reports that she does not drink alcohol and does not use drugs.  No Known Allergies  Family History  Family history unknown: Yes       Prior to Admission medications   Medication Sig Start Date End Date Taking? Authorizing Provider  HYDROCORTISONE ACE, RECTAL, 30 MG SUPP Place 1 suppository rectally 2 (two) times daily as needed. 07/08/20  Yes [provider]  levothyroxine (SYNTHROID, LEVOTHROID) 100 MCG tablet Take 100 mcg by mouth daily before breakfast.   Yes [provider]  lithium carbonate 300 MG capsule Take 3 capsules (900 mg total) by mouth 2 (two) times daily with a meal. Patient taking differently: Take 300 mg by mouth 2 (two) times daily with a meal. 08/13/18  Yes Enedina Finner, MD  LORazepam (ATIVAN) 1 MG tablet Take 1 mg by mouth every 8 (eight) hours.   Yes [provider]  mirtazapine (REMERON) 15 MG tablet Take 15 mg by mouth at bedtime.   Yes [provider]  sertraline (ZOLOFT) 100 MG tablet Take 100 mg by mouth daily. 09/14/12  Yes [provider]  sitaGLIPtin-metformin (JANUMET) 50-1000 MG tablet Take 1 tablet by mouth 2 (two) times daily with a meal.   Yes [provider]    Physical Exam: Vitals:   07/13/20 2245 07/13/20 2247 07/14/20 0030  BP: 138/78  140/90  Pulse: 93  84  Resp: 18  11  Temp: 98 F (36.7 C)    TempSrc: Oral    SpO2: 98%  95%  Weight:  77.1 kg   Height:  5\' 4"  (1.626 m)  Vitals:   07/13/20 2245 07/13/20 2247 07/14/20 0030  BP: 138/78  140/90  Pulse: 93  84  Resp: 18  11  Temp: 98 F (36.7 C)    TempSrc: Oral    SpO2: 98%  95%  Weight:  77.1 kg   Height:  5\' 4"  (1.626 m)       Constitutional: Alert and oriented x 3 . Not in any apparent distress HEENT:      Head: Normocephalic and atraumatic.         Eyes: PERLA, EOMI, Conjunctivae are normal. Sclera is non-icteric.       Mouth/Throat: Mucous membranes are moist.       Neck: Supple with no signs of meningismus. Cardiovascular: Regular rate and rhythm. No murmurs, gallops, or rubs. 2+ symmetrical distal pulses are present . No JVD. No LE  edema Respiratory: Respiratory effort normal .Lungs sounds clear bilaterally. No wheezes, crackles, or rhonchi.  Gastrointestinal: Soft, non tender, and non distended with positive bowel sounds.  Genitourinary: No CVA tenderness. Musculoskeletal: Nontender with normal range of motion in all extremities. No cyanosis, or erythema of extremities. Neurologic:  Face is symmetric. Moving all extremities. No gross focal neurologic deficits . Skin: Skin is warm, dry.  No rash or ulcers Psychiatric: Mood and affect are normal    Labs on Admission: I have personally reviewed following labs and imaging studies  CBC: Recent Labs  Lab 07/13/20 2254  WBC 13.8*  NEUTROABS 10.2*  HGB 13.0  HCT 38.2  MCV 93.4  PLT 341   Basic Metabolic Panel: Recent Labs  Lab 07/13/20 2254  NA 130*  K 3.5  CL 98  CO2 14*  GLUCOSE 167*  BUN 78*  CREATININE 6.53*  CALCIUM 9.5   GFR: Estimated Creatinine Clearance: 8.3 mL/min (A) (by C-G formula based on SCr of 6.53 mg/dL (H)). Liver Function Tests: Recent Labs  Lab 07/13/20 2254  AST 12*  ALT 17  ALKPHOS 150*  BILITOT 0.9  PROT 8.4*  ALBUMIN 4.4   No results for input(s): LIPASE, AMYLASE in the last 168 hours. No results for input(s): AMMONIA in the last 168 hours. Coagulation Profile: No results for input(s): INR, PROTIME in the last 168 hours. Cardiac Enzymes: No results for input(s): CKTOTAL, CKMB, CKMBINDEX, TROPONINI in the last 168 hours. BNP (last 3 results) No results for input(s): PROBNP in the last 8760 hours. HbA1C: No results for input(s): HGBA1C in the last 72 hours. CBG: No results for input(s): GLUCAP in the last 168 hours. Lipid Profile: No results for input(s): CHOL, HDL, LDLCALC, TRIG, CHOLHDL, LDLDIRECT in the last 72 hours. Thyroid Function Tests: No results for input(s): TSH, T4TOTAL, FREET4, T3FREE, THYROIDAB in the last 72 hours. Anemia Panel: No results for input(s): VITAMINB12, FOLATE, FERRITIN, TIBC, IRON,  RETICCTPCT in the last 72 hours. Urine analysis:    Component Value Date/Time   COLORURINE YELLOW (A) 07/14/2020 0044   APPEARANCEUR HAZY (A) 07/14/2020 0044   LABSPEC 1.010 07/14/2020 0044   PHURINE 5.0 07/14/2020 0044   GLUCOSEU NEGATIVE 07/14/2020 0044   HGBUR SMALL (A) 07/14/2020 0044   BILIRUBINUR NEGATIVE 07/14/2020 0044   KETONESUR NEGATIVE 07/14/2020 0044   PROTEINUR 100 (A) 07/14/2020 0044   NITRITE NEGATIVE 07/14/2020 0044   LEUKOCYTESUR NEGATIVE 07/14/2020 0044    Radiological Exams on Admission: No results found.   Assessment/Plan 69 year old female with history of hypothyroidism, chronic diarrhea x1 year and bipolar disorder on lithium with history of hospitalization in 2020 lithium toxicity with a  1 week a complaint of slowed thinking and tremors similar to when she had lithium toxicity a year ago.  Patient taking prescribed dose.    Lithium toxicity in therapeutic use -Lithium level 1.73, symptomatic for tremors and a feeling of slowed thinking. -Possibly related to dehydration from ongoing diarrhea -IV hydration -Monitor lithium levels.  Consulted with poison control -Neurologic checks  AKI (acute kidney injury) (HCC) -Creatinine of 6.53 up from 1.06 a year ago -Likely related to dehydration from ongoing diarrhea as well as lithium toxicity -IV hydration and monitor renal function -Discussed with Dr. Mosetta Pigeon who will see patient in the a.m.  Consult placed.  High anion gap metabolic acidosis -Metabolic acidosis per review not typical of lithium toxicity though likely related to AKI -Follow salicylate level  Hyponatremia -Sodium of 130 and suspecting prerenal in view of history of diarrhea -IV hydration with normal saline -Continue to monitor  Chronic diarrhea -Patient has up to 5 loose BMs daily for the past year per her report -Can consider outpatient versus inpatient GI referral    Bipolar disorder, unspecified (HCC) -Hold lithium given  elevated    Hypothyroidism -Follow TSH and resume levothyroxine if appropriate    DVT prophylaxis: Heparin Code Status: full code  Family Communication:  none  Disposition Plan: Back to previous home environment Consults called: Nephrology Status:At the time of admission, it appears that the appropriate admission status for this patient is INPATIENT. This is judged to be reasonable and necessary in order to provide the required intensity of service to ensure the patient's safety given the presenting symptoms, physical exam findings, and initial radiographic and laboratory data in the context of their  Comorbid conditions.   Patient requires inpatient status due to high intensity of service, high risk for further deterioration and high frequency of surveillance required.   I certify that at the point of admission it is my clinical judgment that the patient will require inpatient hospital care spanning beyond 2 midnights     Andris Baumann MD [facility]     07/14/2020, 1:38 AM

## 2020-07-14 NOTE — ED Notes (Signed)
Pt had episode of diarrhea. Pt provided self peri care.

## 2020-07-14 NOTE — Hospital Course (Addendum)
Jasmine Simmons is a 70 y.o. female with medical history significant for hypothyroidism, chronic diarrhea and bipolar disorder on lithium with history of hospitalization in 2020 lithium toxicity who presented to the ER with a complaint of slowed thinking/brain fog and tremors for about a week, similar to when she had lithium toxicity a year ago.  She had been taking her usual dose of lithium, however had been having nausea and vomiting in addition to her baseline chronic diarrhea.  Evaluation in the ED showed acute renal failure with elevated lithium level 1.73, creatinine 6.53 (baseline 1.06-year ago), associated anion gap metabolic acidosis, leukocytosis 13k.  EKG was normal sinus rhythm with mild QTC prolongation of 464.  Poison control was contacted and recommended IV hydration and serial lithium levels.  Patient was admitted to hospitalist service for management of lithium toxicity likely due to dehydration and associated acute kidney injury.

## 2020-07-14 NOTE — ED Notes (Signed)
Pt ambulatory to bathroom with steady gait. Back in bed. °

## 2020-07-14 NOTE — ED Notes (Addendum)
Pt ambulatory to bathroom with steady gait. Back in bed.

## 2020-07-14 NOTE — ED Notes (Signed)
Dinner tray on bedside table.  

## 2020-07-14 NOTE — Consult Note (Signed)
[address] [place], [place] [address] Phone [phone]. Fax [phone]  Date: 07/14/2020                  Patient Name:  Jasmine Simmons  MRN: [id number]  DOB: 05/10/1951  Age / Sex: 69 y.o., female         PCP: Patient, No Pcp Per                 Service Requesting Consult: IM/ Pennie Banter, DO                 Reason for Consult: ARF, lithium toxicity            History of Present Illness: Patient is a 69 y.o. female  was admitted to [facility] on 07/13/2020 for evaluation of Lithium toxicity [T56.891A]  Patient presented to the emergency room for complaints of tremors and fogginess of mind.  She also reports having diarrhea several days prior to admission.  Patient states she did not have any nausea or vomiting.  She was able to keep up with fluid intake.  No problems with voiding. In the ER, noted to have elevated creatinine at 6.53, elevated lithium level at 1.74 range 0.6-1.20 She was treated with IV normal saline.  Serum creatinine and lithium levels are improving   Medications: Outpatient medications: (Not in a hospital admission)   Current medications: Current Facility-Administered Medications  Medication Dose Route Frequency Provider Last Rate Last Admin  . 0.9 %  sodium chloride infusion   Intravenous Continuous Andris Baumann, MD 125 mL/hr at 07/14/20 0340 New Bag at 07/14/20 0340  . acetaminophen (TYLENOL) tablet 650 mg  650 mg Oral Q6H PRN Andris Baumann, MD       Or  . acetaminophen (TYLENOL) suppository 650 mg  650 mg Rectal Q6H PRN Andris Baumann, MD      . heparin injection 5,000 Units  5,000 Units Subcutaneous Q8H Andris Baumann, MD   5,000 Units at 07/14/20 0506  . ondansetron (ZOFRAN) tablet 4 mg  4 mg Oral Q6H PRN Andris Baumann, MD       Or  . ondansetron [facility]) injection 4 mg  4 mg Intravenous Q6H PRN Andris Baumann, MD       Current Outpatient Medications  Medication Sig Dispense Refill  . HYDROCORTISONE ACE, RECTAL, 30  MG SUPP Place 1 suppository rectally 2 (two) times daily as needed.    Marland Kitchen levothyroxine (SYNTHROID, LEVOTHROID) 100 MCG tablet Take 100 mcg by mouth daily before breakfast.    . lithium carbonate 300 MG capsule Take 3 capsules (900 mg total) by mouth 2 (two) times daily with a meal. (Patient taking differently: Take 300 mg by mouth 2 (two) times daily with a meal.) 10 capsule 0  . LORazepam (ATIVAN) 1 MG tablet Take 1 mg by mouth every 8 (eight) hours.    . mirtazapine (REMERON) 15 MG tablet Take 15 mg by mouth at bedtime.    . sertraline (ZOLOFT) 100 MG tablet Take 100 mg by mouth daily.    . sitaGLIPtin-metformin (JANUMET) 50-1000 MG tablet Take 1 tablet by mouth 2 (two) times daily with a meal.        Allergies: No Known Allergies    Past Medical History: Past Medical History:  Diagnosis Date  . Bipolar 1 disorder [facility])      Past Surgical History: Past Surgical History:  Procedure Laterality Date  . CESAREAN SECTION  Family History: Family History  Family history unknown: Yes     Social History: Social History   Socioeconomic History  . Marital status: Single    Spouse name: Not on file  . Number of children: Not on file  . Years of education: Not on file  . Highest education level: Not on file  Occupational History  . Not on file  Tobacco Use  . Smoking status: Never Smoker  . Smokeless tobacco: Never Used  Vaping Use  . Vaping Use: Never used  Substance and Sexual Activity  . Alcohol use: Never  . Drug use: Never  . Sexual activity: Not on file  Other Topics Concern  . Not on file  Social History Narrative  . Not on file   Social Determinants of Health   Financial Resource Strain: Not on file  Food Insecurity: Not on file  Transportation Needs: Not on file  Physical Activity: Not on file  Stress: Not on file  Social Connections: Not on file  Intimate Partner Violence: Not on file     Review of Systems: Gen: No fevers or chills HEENT:  No vision or hearing complaints CV: No chest pain or shortness of breath, no leg edema Resp: No cough or sputum production GI: No nausea or vomiting or blood in stool.  Reports diarrhea GU : No blood in the urine.  No previous history of kidney problems. MS: Ambulatory at baseline Derm:    No complaints Psych: History of bipolar disorder Heme: No complaints Neuro: Fogginess of mind when she first presented Endocrine.  Taking medicines for metabolic syndrome  Vital Signs: Blood pressure 97/61, pulse 85, temperature 98 F (36.7 C), temperature source Oral, resp. rate (!) 21, height 5\' 4"  (1.626 m), weight 77.1 kg, SpO2 100 %.   Intake/Output Summary (Last 24 hours) at 07/14/2020 0913 Last data filed at 07/14/2020 0849 Gross per 24 hour  Intake --  Output 1700 ml  Net -1700 ml    Weight trends: 07/16/2020   07/13/20 2247  Weight: 77.1 kg   Physical Exam: General:  No acute distress, laying in the bed  HEENT  anicteric, mouth appears dry  Pulm/lungs  normal breathing effort, lungs are clear to auscultation  CVS/Heart  regular rhythm, no rub or gallop  Abdomen:   Soft, nontender  Extremities:  No peripheral edema  Neurologic:  Alert, oriented, able to follow commands  Skin:  No acute rashes      Lab results: Basic Metabolic Panel: Recent Labs  Lab 07/13/20 2254 07/14/20 0123 07/14/20 0506  NA 130* 131* 133*  K 3.5 3.2* 3.2*  CL 98 100 106  CO2 14* 17* 16*  GLUCOSE 167* 128* 137*  BUN 78* 77* 74*  CREATININE 6.53* 6.23* 5.97*  CALCIUM 9.5 9.0 8.1*    Liver Function Tests: Recent Labs  Lab 07/13/20 2254  AST 12*  ALT 17  ALKPHOS 150*  BILITOT 0.9  PROT 8.4*  ALBUMIN 4.4   No results for input(s): LIPASE, AMYLASE in the last 168 hours. No results for input(s): AMMONIA in the last 168 hours.  CBC: Recent Labs  Lab 07/13/20 2254 07/14/20 0136 07/14/20 0506  WBC 13.8* 11.2* 10.3  NEUTROABS 10.2*  --   --   HGB 13.0 12.5 11.0*  HCT 38.2 36.6  32.7*  MCV 93.4 94.6 95.3  PLT 341 295 255    Cardiac Enzymes: No results for input(s): CKTOTAL, TROPONINI in the last 168 hours.  BNP: Invalid input(s): POCBNP  CBG: No results for input(s): GLUCAP in the last 168 hours.  Microbiology: Recent Results (from the past 720 hour(s))  Resp Panel by RT-PCR (Flu A&B, Covid) Nasopharyngeal Swab     Status: None   Collection Time: 07/14/20 12:26 AM   Specimen: Nasopharyngeal Swab; Nasopharyngeal(NP) swabs in vial transport medium  Result Value Ref Range Status   SARS Coronavirus 2 by RT PCR NEGATIVE NEGATIVE Final    Comment: (NOTE) SARS-CoV-2 target nucleic acids are NOT DETECTED.  The SARS-CoV-2 RNA is generally detectable in upper respiratory specimens during the acute phase of infection. The lowest concentration of SARS-CoV-2 viral copies this assay can detect is 138 copies/mL. A negative result does not preclude SARS-Cov-2 infection and should not be used as the sole basis for treatment or other patient management decisions. A negative result may occur with  improper specimen collection/handling, submission of specimen other than nasopharyngeal swab, presence of viral mutation(s) within the areas targeted by this assay, and inadequate number of viral copies(<138 copies/mL). A negative result must be combined with clinical observations, patient history, and epidemiological information. The expected result is Negative.  Fact Sheet for Patients:  BloggerCourse.comhttps://www.fda.gov/media/152166/download  Fact Sheet for Healthcare Providers:  SeriousBroker.ithttps://www.fda.gov/media/152162/download  This test is no t yet approved or cleared by the [place] FDA and  has been authorized for detection and/or diagnosis of SARS-CoV-2 by FDA under an Emergency Use Authorization (EUA). This EUA will remain  in effect (meaning this test can be used) for the duration of the COVID-19 declaration under Section 564(b)(1) of the Act, 21 U.S.C.section  360bbb-3(b)(1), unless the authorization is terminated  or revoked sooner.       Influenza A by PCR NEGATIVE NEGATIVE Final   Influenza B by PCR NEGATIVE NEGATIVE Final    Comment: (NOTE) The Xpert Xpress SARS-CoV-2/FLU/RSV plus assay is intended as an aid in the diagnosis of influenza from Nasopharyngeal swab specimens and should not be used as a sole basis for treatment. Nasal washings and aspirates are unacceptable for Xpert Xpress SARS-CoV-2/FLU/RSV testing.  Fact Sheet for Patients: BloggerCourse.comhttps://www.fda.gov/media/152166/download  Fact Sheet for Healthcare Providers: SeriousBroker.ithttps://www.fda.gov/media/152162/download  This test is not yet approved or cleared by the [place] FDA and has been authorized for detection and/or diagnosis of SARS-CoV-2 by FDA under an Emergency Use Authorization (EUA). This EUA will remain in effect (meaning this test can be used) for the duration of the COVID-19 declaration under Section 564(b)(1) of the Act, 21 U.S.C. section 360bbb-3(b)(1), unless the authorization is terminated or revoked.  Performed at [facility], [address] [place], [place] [address]      Coagulation Studies: No results for input(s): LABPROT, INR in the last 72 hours.  Urinalysis: Recent Labs    07/14/20 0044  COLORURINE YELLOW*  LABSPEC 1.010  PHURINE 5.0  GLUCOSEU NEGATIVE  HGBUR SMALL*  BILIRUBINUR NEGATIVE  KETONESUR NEGATIVE  PROTEINUR 100*  NITRITE NEGATIVE  LEUKOCYTESUR NEGATIVE        Imaging:  No results found.   Assessment & Plan: Pt is a 69 y.o.   female with bipolar disorder, was admitted on 07/13/2020 with Lithium toxicity [T56.891A]   #Acute kidney injury Baseline creatinine of 1.06 from August 13, 2018.  Presenting creatinine of 6.53.  Patient is nonoliguric.  Serum creatinine trends have already started to improve with IV hydration.  Urinalysis indicates small blood, proteinuria.  0-5 RBCs.  Many bacteria.  No urine imaging  is available. Plan: Obtain urine protein creatinine ratio Obtain renal imaging Continue IV  hydration Serum creatinine trend has started to improve with IV hydration Electrolytes and volume status are acceptable.  No acute indication for dialysis  #Lithium toxicity Mild.  Denies any intentional overdose.  Level is elevated likely due to renal insufficiency.  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 Any transcription errors are unintentional

## 2020-07-14 NOTE — ED Notes (Signed)
Pt alert. Resting calmly in bed. Denies any needs currently.

## 2020-07-14 NOTE — ED Notes (Signed)
HOB adjusted for pt as requested. 

## 2020-07-14 NOTE — ED Notes (Signed)
Pt assisted up to bedside toilet. Standby assist. Pt urinated large amount and had small soft BM.

## 2020-07-14 NOTE — Progress Notes (Signed)
  PROGRESS NOTE    Jasmine Simmons  QAS:[id number] DOB: 1951/06/06 DOA: 07/13/2020  PCP: Jasmine Simmons, No Pcp Per    LOS - 0    Jasmine Simmons admitted earlier this morning with lithium toxicity and acute renal failure in the setting of dehydration from nausea vomiting in addition to chronic diarrhea.  Interval subjective: Jasmine Simmons seen in the ED holding for a bed.  States feeling about the same as at time of admission at this point.  Was able to tolerate some food earlier however.  States she is thirsty but no other acute complaints.  Exam: Awake and alert, lungs clear bilaterally, heart regular rate and rhythm, no peripheral edema.  Abdomen soft and nontender.  Mild tremor of outstretched hands.   Principal Problem:   Lithium toxicity Active Problems:   AKI (acute kidney injury) (HCC)   High anion gap metabolic acidosis   Bipolar disorder, unspecified (HCC)   Hypothyroidism    I have reviewed the full H&P by Dr. Para March in detail, and I agree with the assessment and plan as outlined therein. In addition: --Per poison control, increase IV fluid rate to 250 cc/h --Monitor lithium levels every 6 hours   No Charge    Pennie Banter, DO [facility]   If 7PM-7AM, please contact night-coverage [email or web address] 07/14/2020, 8:30 AM

## 2020-07-14 NOTE — ED Notes (Signed)
Pt set off call bell. This RN not able to go to bedside immediately as was assisting other pt. Once to bedside pt had pulled off all monitoring equipment and was pulling on IV line standing at the end of her bed. Asked pt what was needed and assisted carefully back into bed. Assessed and adjusted IV as pump had started alarming occluded as pt had pulled on it so hard that tape was shifted kinking line. Fixed. IV pump no longer alarming. Pt stated "I couldn't take the beeping any more". Educated and reminded not to get out of bed without assistance due to potential for fall. Pt agreeable. All monitor cords reattached to pt. Bed locked low. Rail up. Call bell within reach. [place] yellow socks remain on pt's feet.

## 2020-07-14 NOTE — ED Notes (Signed)
Nephrologist at bedside

## 2020-07-14 NOTE — ED Notes (Signed)
Visitor to bedside.  

## 2020-07-15 ENCOUNTER — Other Ambulatory Visit: Payer: Self-pay

## 2020-07-15 DIAGNOSIS — T56891A Toxic effect of other metals, accidental (unintentional), initial encounter: Secondary | ICD-10-CM

## 2020-07-15 LAB — CBC
HCT: 35.4 % — ABNORMAL LOW (ref 36.0–46.0)
Hemoglobin: 11.6 g/dL — ABNORMAL LOW (ref 12.0–15.0)
MCH: 32 pg (ref 26.0–34.0)
MCHC: 32.8 g/dL (ref 30.0–36.0)
MCV: 97.8 fL (ref 80.0–100.0)
Platelets: 244 10*3/uL (ref 150–400)
RBC: 3.62 MIL/uL — ABNORMAL LOW (ref 3.87–5.11)
RDW: 13.3 % (ref 11.5–15.5)
WBC: 8.1 10*3/uL (ref 4.0–10.5)
nRBC: 0 % (ref 0.0–0.2)

## 2020-07-15 LAB — BASIC METABOLIC PANEL
Anion gap: 8 (ref 5–15)
BUN: 52 mg/dL — ABNORMAL HIGH (ref 8–23)
CO2: 17 mmol/L — ABNORMAL LOW (ref 22–32)
Calcium: 7.2 mg/dL — ABNORMAL LOW (ref 8.9–10.3)
Chloride: 116 mmol/L — ABNORMAL HIGH (ref 98–111)
Creatinine, Ser: 3.55 mg/dL — ABNORMAL HIGH (ref 0.44–1.00)
GFR, Estimated: 13 mL/min — ABNORMAL LOW (ref >60)
Glucose, Bld: 133 mg/dL — ABNORMAL HIGH (ref 70–99)
Potassium: 3.6 mmol/L (ref 3.5–5.1)
Sodium: 141 mmol/L (ref 135–145)

## 2020-07-15 LAB — GLUCOSE, CAPILLARY: Glucose-Capillary: 151 mg/dL — ABNORMAL HIGH (ref 70–99)

## 2020-07-15 LAB — CBG MONITORING, ED: Glucose-Capillary: 118 mg/dL — ABNORMAL HIGH (ref 70–99)

## 2020-07-15 NOTE — Progress Notes (Signed)
[facility]  ROUNDING NOTE   Subjective:  Came in with tremors and lithium toxicity. Creatinine currently down to 3.5. Lithium level down to 1.16.   Objective:  Vital signs in last 24 hours:  Pulse Rate:  [76-89] 76 (12/27 1000) Resp:  [11-20] 19 (12/27 1000) BP: (120-171)/(56-96) 149/67 (12/27 1000) SpO2:  [94 %-100 %] 98 % (12/27 1000)  Weight change:  Filed Weights   07/13/20 2247  Weight: 77.1 kg    Intake/Output: I/O last 3 completed shifts: In: -  Out: 1800 [Urine:1800]   Intake/Output this shift:  No intake/output data recorded.  Physical Exam: General:  No acute distress  Head:  Normocephalic, atraumatic. Moist oral mucosal membranes  Eyes:  Anicteric  Neck:  Supple  Lungs:   Clear to auscultation, normal effort  Heart:  S1S2 no rubs  Abdomen:   Soft, nontender, bowel sounds present  Extremities:  No peripheral edema.  Neurologic:  Awake, alert, following commands  Skin:  No lesions  Access:  No dialysis access    Basic Metabolic Panel: Recent Labs  Lab 07/13/20 2254 07/14/20 0123 07/14/20 0506 07/15/20 0511  NA 130* 131* 133* 141  K 3.5 3.2* 3.2* 3.6  CL 98 100 106 116*  CO2 14* 17* 16* 17*  GLUCOSE 167* 128* 137* 133*  BUN 78* 77* 74* 52*  CREATININE 6.53* 6.23* 5.97* 3.55*  CALCIUM 9.5 9.0 8.1* 7.2*    Liver Function Tests: Recent Labs  Lab 07/13/20 2254  AST 12*  ALT 17  ALKPHOS 150*  BILITOT 0.9  PROT 8.4*  ALBUMIN 4.4   No results for input(s): LIPASE, AMYLASE in the last 168 hours. No results for input(s): AMMONIA in the last 168 hours.  CBC: Recent Labs  Lab 07/13/20 2254 07/14/20 0136 07/14/20 0506 07/15/20 0511  WBC 13.8* 11.2* 10.3 8.1  NEUTROABS 10.2*  --   --   --   HGB 13.0 12.5 11.0* 11.6*  HCT 38.2 36.6 32.7* 35.4*  MCV 93.4 94.6 95.3 97.8  PLT 341 295 255 244    Cardiac Enzymes: No results for input(s): CKTOTAL, CKMB, CKMBINDEX, TROPONINI in the last 168 hours.  BNP: Invalid input(s):  POCBNP  CBG: Recent Labs  Lab 07/14/20 1554 07/14/20 1800 07/14/20 2354  GLUCAP 154* 159* 126*    Microbiology: Results for orders placed or performed during the hospital encounter of 07/13/20  Resp Panel by RT-PCR (Flu A&B, Covid) Nasopharyngeal Swab     Status: None   Collection Time: 07/14/20 12:26 AM   Specimen: Nasopharyngeal Swab; Nasopharyngeal(NP) swabs in vial transport medium  Result Value Ref Range Status   SARS Coronavirus 2 by RT PCR NEGATIVE NEGATIVE Final    Comment: (NOTE) SARS-CoV-2 target nucleic acids are NOT DETECTED.  The SARS-CoV-2 RNA is generally detectable in upper respiratory specimens during the acute phase of infection. The lowest concentration of SARS-CoV-2 viral copies this assay can detect is 138 copies/mL. A negative result does not preclude SARS-Cov-2 infection and should not be used as the sole basis for treatment or other patient management decisions. A negative result may occur with  improper specimen collection/handling, submission of specimen other than nasopharyngeal swab, presence of viral mutation(s) within the areas targeted by this assay, and inadequate number of viral copies(<138 copies/mL). A negative result must be combined with clinical observations, patient history, and epidemiological information. The expected result is Negative.  Fact Sheet for Patients:  BloggerCourse.com  Fact Sheet for Healthcare Providers:  SeriousBroker.it  This test is  no t yet approved or cleared by the [place] and  has been authorized for detection and/or diagnosis of SARS-CoV-2 by FDA under an Emergency Use Authorization (EUA). This EUA will remain  in effect (meaning this test can be used) for the duration of the COVID-19 declaration under Section 564(b)(1) of the Act, 21 U.S.C.section 360bbb-3(b)(1), unless the authorization is terminated  or revoked sooner.       Influenza A by PCR  NEGATIVE NEGATIVE Final   Influenza B by PCR NEGATIVE NEGATIVE Final    Comment: (NOTE) The Xpert Xpress SARS-CoV-2/FLU/RSV plus assay is intended as an aid in the diagnosis of influenza from Nasopharyngeal swab specimens and should not be used as a sole basis for treatment. Nasal washings and aspirates are unacceptable for Xpert Xpress SARS-CoV-2/FLU/RSV testing.  Fact Sheet for Patients: BloggerCourse.com  Fact Sheet for Healthcare Providers: SeriousBroker.it  This test is not yet approved or cleared by the [place] FDA and has been authorized for detection and/or diagnosis of SARS-CoV-2 by FDA under an Emergency Use Authorization (EUA). This EUA will remain in effect (meaning this test can be used) for the duration of the COVID-19 declaration under Section 564(b)(1) of the Act, 21 U.S.C. section 360bbb-3(b)(1), unless the authorization is terminated or revoked.  Performed at [facility], [address] [place], [place] [address]     Coagulation Studies: No results for input(s): LABPROT, INR in the last 72 hours.  Urinalysis: Recent Labs    07/14/20 0044  COLORURINE YELLOW*  LABSPEC 1.010  PHURINE 5.0  GLUCOSEU NEGATIVE  HGBUR SMALL*  BILIRUBINUR NEGATIVE  KETONESUR NEGATIVE  PROTEINUR 100*  NITRITE NEGATIVE  LEUKOCYTESUR NEGATIVE      Imaging: US RENAL  Result Date: 07/14/2020 CLINICAL DATA:  Acute renal failure EXAM: RENAL / URINARY TRACT ULTRASOUND COMPLETE COMPARISON:  None FINDINGS: Right Kidney: Renal measurements: 10.7 x 5.2 x 5.3 cm = volume: 157 mL. Normal morphology without mass or hydronephrosis. Left Kidney: Renal measurements: 11.4 x 5.4 x 4.2 cm = volume: 137 mL. Normal cortical thickness and echogenicity. Small cyst at mid to inferior pole 16 x 15 x 14 mm. No additional mass, hydronephrosis or shadowing calcification. Bladder: Appears normal for degree of bladder distention. Other:  Incidentally noted increased echogenicity of the liver, question fatty infiltration of this can be seen with cirrhosis and some infiltrative disorders. IMPRESSION: Small LEFT renal cyst. Otherwise negative renal ultrasound. Suspected fatty infiltration of liver as above. Electronically Signed   By: Ulyses Southward M.D.   On: 07/14/2020 10:44     Medications:   . sodium chloride 250 mL/hr at 07/14/20 2031   . heparin  5,000 Units Subcutaneous Q8H  . insulin aspart  0-9 Units Subcutaneous TID WC  . potassium chloride  20 mEq Oral BID   acetaminophen **OR** acetaminophen, ondansetron **OR** ondansetron (ZOFRAN) IV  Assessment/ Plan:  69 y.o. female with bipolar disorder admitted on 07/13/2020 with lithium toxicity and tremors.  1.  Acute kidney injury.  Baseline creatinine 1.06 on 08/13/2018.  Presenting creatinine 6.5.  Renal ultrasound unremarkable with the exception of small left renal cyst.  Creatinine down to 3.5.  No immediate need for dialysis as renal function improving with IV fluid hydration.  Continue 0.9 normal saline at 125 cc/h.  2.  Lithium toxicity.  Lithium level now down to 1.16.  No immediate need for dialysis.   LOS: 1 Orian Amberg 12/27/20211:38 PM

## 2020-07-15 NOTE — Plan of Care (Signed)
  Problem: Education: Goal: Knowledge of General Education information will improve Description: Including pain rating scale, medication(s)/side effects and non-pharmacologic comfort measures Outcome: Progressing   Problem: Clinical Measurements: Goal: Ability to maintain clinical measurements within normal limits will improve Outcome: Progressing Goal: Diagnostic test results will improve Outcome: Progressing   Problem: Safety: Goal: Ability to remain free from injury will improve Outcome: Progressing   

## 2020-07-15 NOTE — Progress Notes (Signed)
PROGRESS NOTE    Jasmine Simmons   STM:[id number]  DOB: 1951-04-13  PCP: Patient, No Pcp Per    DOA: 07/13/2020 LOS: 1   Brief Narrative   Jasmine Simmons is a 69 y.o. female with medical history significant for hypothyroidism, chronic diarrhea and bipolar disorder on lithium with history of hospitalization in 2020 lithium toxicity who presented to the ER with a complaint of slowed thinking/brain fog and tremors for about a week, similar to when she had lithium toxicity a year ago.  She had been taking her usual dose of lithium, however had been having nausea and vomiting in addition to her baseline chronic diarrhea.  Evaluation in the ED showed acute renal failure with elevated lithium level 1.73, creatinine 6.53 (baseline 1.06-year ago), associated anion gap metabolic acidosis, leukocytosis 13k.  EKG was normal sinus rhythm with mild QTC prolongation of 464.  Poison control was contacted and recommended IV hydration and serial lithium levels.  Patient was admitted to hospitalist service for management of lithium toxicity likely due to dehydration and associated acute kidney injury     Assessment & Plan   Principal Problem:   Lithium toxicity Active Problems:   AKI (acute kidney injury) (HCC)   High anion gap metabolic acidosis   Bipolar disorder, unspecified (HCC)   Hypothyroidism   Lithium toxicity - due to severe AKI and poor clearance.  Pt compliant with prescribed dose. --12/26 Per poison control, IV fluids at 250 cc/h --Monitor lithium levels every 6 hours --12/27 lithium level has normalized, reduce IV fluids to 125 cc/hr to continue for AKI --continue to trend levels --psych consult, recs appreciated  Acute kidney injury - likely hypovolemic given N/V on top of chronic diarrhea.   Renal U/S negative for hydronephrosis to suggest obstructive uropathy. Cr trending down with IV fluids. --Continue IV hydration, reduce rate now that lithium level has  normalized  Nausea/vomiting - POA, improving.  PRN antiemetics.    Hyponatremia - mild, Na 130 on admission, likely due to N/V/D and hypovolemia.   Resolved with IV fluids.   --Monitor BMP  Chronic diarrhea - at baseline per pt.  Contributing factor to hypovolemia and AKI along with concurrent N/V.   Anion gap metabolic acidosis - resolved.  POA due to renal failure.  Bipolar disorder - with lithium toxicity on admission which was due to acute renal failure.  Psych consulted. 12/27 - pt reports hallucinations last night, she states typical for her without lithium  Hypothyroidism - on levothyroxine.  TSH within normal 1.650.     Patient BMI: Body mass index is 29.18 kg/m.   DVT prophylaxis: heparin injection 5,000 Units Start: 07/14/20 0600   Diet:  Diet Orders (From admission, onward)    Start     Ordered   07/14/20 0134  Diet regular Room service appropriate? Yes; Fluid consistency: Thin  Diet effective now       Question Answer Comment  Room service appropriate? Yes   Fluid consistency: Thin      07/14/20 0137            Code Status: Full Code    Subjective 07/15/20    Pt seen in ED on hold for a bed.  Reports hallucinations last night which she says are typical without lithium.  Nausea improved.  Tolerating some PO intake, but not much.  Poor sleep overnight   Disposition Plan & Communication   Status is: Inpatient  Inpatient status remains appropriate due to severity of illness with continued  renal failure requiring IV fluids, psych consulted  Dispo: The patient is from: home              Anticipated d/c is to: home              Anticipated d/c date is: 2 days              Medically stable for d/c: No   Family Communication: close friend update on rounds today   Consults, Procedures, Significant Events   Consultants:   Psychiatry  Procedures:   None  Antimicrobials:  Anti-infectives (From admission, onward)   None       Objective    Vitals:   07/15/20 0530 07/15/20 0600 07/15/20 0630 07/15/20 0700  BP: (!) 152/62 (!) 157/71 130/65   Pulse: 85 80 81 83  Resp: 17 13 13 12   Temp:      TempSrc:      SpO2: 99% 98% 99% 100%  Weight:      Height:        Intake/Output Summary (Last 24 hours) at 07/15/2020 0850 Last data filed at 07/14/2020 0915 Gross per 24 hour  Intake --  Output 100 ml  Net -100 ml   Filed Weights   07/13/20 2247  Weight: 77.1 kg    Physical Exam:  General exam: awake, alert, no acute distress HEENT: moist mucus membranes, hearing grossly normal  Respiratory system: CTAB, no wheezes, rales or rhonchi, normal respiratory effort. Cardiovascular system: normal S1/S2, RRR, no pedal edema.   Gastrointestinal system: soft, NT, ND Central nervous system: A&O x3. no gross focal neurologic deficits, normal speech Extremities: moves all, no edema, normal tone Psychiatry: normal mood, flat affect, judgement and insight appear normal  Labs   Data Reviewed: I have personally reviewed following labs and imaging studies  CBC: Recent Labs  Lab 07/13/20 2254 07/14/20 0136 07/14/20 0506 07/15/20 0511  WBC 13.8* 11.2* 10.3 8.1  NEUTROABS 10.2*  --   --   --   HGB 13.0 12.5 11.0* 11.6*  HCT 38.2 36.6 32.7* 35.4*  MCV 93.4 94.6 95.3 97.8  PLT 341 295 255 244   Basic Metabolic Panel: Recent Labs  Lab 07/13/20 2254 07/14/20 0123 07/14/20 0506 07/15/20 0511  NA 130* 131* 133* 141  K 3.5 3.2* 3.2* 3.6  CL 98 100 106 116*  CO2 14* 17* 16* 17*  GLUCOSE 167* 128* 137* 133*  BUN 78* 77* 74* 52*  CREATININE 6.53* 6.23* 5.97* 3.55*  CALCIUM 9.5 9.0 8.1* 7.2*   GFR: Estimated Creatinine Clearance: 15.3 mL/min (A) (by C-G formula based on SCr of 3.55 mg/dL (H)). Liver Function Tests: Recent Labs  Lab 07/13/20 2254  AST 12*  ALT 17  ALKPHOS 150*  BILITOT 0.9  PROT 8.4*  ALBUMIN 4.4   No results for input(s): LIPASE, AMYLASE in the last 168 hours. No results for input(s): AMMONIA in  the last 168 hours. Coagulation Profile: No results for input(s): INR, PROTIME in the last 168 hours. Cardiac Enzymes: No results for input(s): CKTOTAL, CKMB, CKMBINDEX, TROPONINI in the last 168 hours. BNP (last 3 results) No results for input(s): PROBNP in the last 8760 hours. HbA1C: Recent Labs    07/14/20 1625  HGBA1C 7.2*   CBG: Recent Labs  Lab 07/14/20 1554 07/14/20 1800 07/14/20 2354  GLUCAP 154* 159* 126*   Lipid Profile: No results for input(s): CHOL, HDL, LDLCALC, TRIG, CHOLHDL, LDLDIRECT in the last 72 hours. Thyroid Function Tests: Recent Labs  07/14/20 0136  TSH 1.650   Anemia Panel: No results for input(s): VITAMINB12, FOLATE, FERRITIN, TIBC, IRON, RETICCTPCT in the last 72 hours. Sepsis Labs: No results for input(s): PROCALCITON, LATICACIDVEN in the last 168 hours.  Recent Results (from the past 240 hour(s))  Resp Panel by RT-PCR (Flu A&B, Covid) Nasopharyngeal Swab     Status: None   Collection Time: 07/14/20 12:26 AM   Specimen: Nasopharyngeal Swab; Nasopharyngeal(NP) swabs in vial transport medium  Result Value Ref Range Status   SARS Coronavirus 2 by RT PCR NEGATIVE NEGATIVE Final    Comment: (NOTE) SARS-CoV-2 target nucleic acids are NOT DETECTED.  The SARS-CoV-2 RNA is generally detectable in upper respiratory specimens during the acute phase of infection. The lowest concentration of SARS-CoV-2 viral copies this assay can detect is 138 copies/mL. A negative result does not preclude SARS-Cov-2 infection and should not be used as the sole basis for treatment or other patient management decisions. A negative result may occur with  improper specimen collection/handling, submission of specimen other than nasopharyngeal swab, presence of viral mutation(s) within the areas targeted by this assay, and inadequate number of viral copies(<138 copies/mL). A negative result must be combined with clinical observations, patient history, and  epidemiological information. The expected result is Negative.  Fact Sheet for Patients:  BloggerCourse.comhttps://www.fda.gov/media/152166/download  Fact Sheet for Healthcare Providers:  SeriousBroker.ithttps://www.fda.gov/media/152162/download  This test is no t yet approved or cleared by the [place] FDA and  has been authorized for detection and/or diagnosis of SARS-CoV-2 by FDA under an Emergency Use Authorization (EUA). This EUA will remain  in effect (meaning this test can be used) for the duration of the COVID-19 declaration under Section 564(b)(1) of the Act, 21 U.S.C.section 360bbb-3(b)(1), unless the authorization is terminated  or revoked sooner.       Influenza A by PCR NEGATIVE NEGATIVE Final   Influenza B by PCR NEGATIVE NEGATIVE Final    Comment: (NOTE) The Xpert Xpress SARS-CoV-2/FLU/RSV plus assay is intended as an aid in the diagnosis of influenza from Nasopharyngeal swab specimens and should not be used as a sole basis for treatment. Nasal washings and aspirates are unacceptable for Xpert Xpress SARS-CoV-2/FLU/RSV testing.  Fact Sheet for Patients: BloggerCourse.comhttps://www.fda.gov/media/152166/download  Fact Sheet for Healthcare Providers: SeriousBroker.ithttps://www.fda.gov/media/152162/download  This test is not yet approved or cleared by the [place] FDA and has been authorized for detection and/or diagnosis of SARS-CoV-2 by FDA under an Emergency Use Authorization (EUA). This EUA will remain in effect (meaning this test can be used) for the duration of the COVID-19 declaration under Section 564(b)(1) of the Act, 21 U.S.C. section 360bbb-3(b)(1), unless the authorization is terminated or revoked.  Performed at [facility], [address] [place], [place] [address]       Imaging Studies   US RENAL  Result Date: 07/14/2020 CLINICAL DATA:  Acute renal failure EXAM: RENAL / URINARY TRACT ULTRASOUND COMPLETE COMPARISON:  None FINDINGS: Right Kidney: Renal measurements: 10.7 x 5.2 x 5.3  cm = volume: 157 mL. Normal morphology without mass or hydronephrosis. Left Kidney: Renal measurements: 11.4 x 5.4 x 4.2 cm = volume: 137 mL. Normal cortical thickness and echogenicity. Small cyst at mid to inferior pole 16 x 15 x 14 mm. No additional mass, hydronephrosis or shadowing calcification. Bladder: Appears normal for degree of bladder distention. Other: Incidentally noted increased echogenicity of the liver, question fatty infiltration of this can be seen with cirrhosis and some infiltrative disorders. IMPRESSION: Small LEFT renal cyst. Otherwise negative renal ultrasound.  Suspected fatty infiltration of liver as above. Electronically Signed   By: Ulyses Southward M.D.   On: 07/14/2020 10:44     Medications   Scheduled Meds: . heparin  5,000 Units Subcutaneous Q8H  . insulin aspart  0-9 Units Subcutaneous TID WC  . potassium chloride  20 mEq Oral BID   Continuous Infusions: . sodium chloride 250 mL/hr at 07/14/20 2031       LOS: 1 day    Time spent: 25 minutes    Pennie Banter, DO [facility]  07/15/2020, 8:50 AM    If 7PM-7AM, please contact night-coverage. How to contact the [facility] Attending or Consulting provider 7A - 7P or covering provider during after hours 7P -7A, for this patient?    1. Check the care team in [facility] and look for a) attending/consulting [facility] provider listed and b) the [facility] team listed 2. Log into [email or web address] and use [facility]'s universal password to access. If you do not have the password, please contact the hospital operator. 3. Locate the [facility] provider you are looking for under [facility] and page to a number that you can be directly reached. 4. If you still have difficulty reaching the provider, please page the [facility] (Director on Call) for the Hospitalists listed on amion for assistance.

## 2020-07-15 NOTE — ED Notes (Signed)
Cleaned patient up after she wet herself. Replaced pure wick and changed bed linens.

## 2020-07-15 NOTE — Consult Note (Signed)
[facility] Face-to-Face Psychiatry Consult   Reason for Consult: Consult for 69 year old woman who presented to the hospital with physical symptoms likely related to lithium toxicity Referring Physician: Denton Lank Patient Identification: Jasmine Simmons MRN:  [id number] Principal Diagnosis: Lithium toxicity Diagnosis:  Principal Problem:   Lithium toxicity Active Problems:   AKI (acute kidney injury) (HCC)   High anion gap metabolic acidosis   Bipolar disorder, unspecified (HCC)   Hypothyroidism   Total Time spent with patient: 1 hour  Subjective:   Jasmine Simmons is a 69 y.o. female patient admitted with "I was feeling ill".  HPI: Patient seen chart reviewed.  Patient came to the emergency room saying she had been feeling ill for about a week.  She was feeling weak somewhat sick to her stomach confused feeling like she was not cognitively functioning right.  Coming to the emergency room she was found to have renal impairment with creatinine grossly elevated and to be toxic on her lithium with a level of near 2.  On interview today the patient states that her mood had been stable recently.  No major spells of depression no mania.  She denies that she missed used or overtook any of her lithium.  Denies any suicidal thoughts or psychosis.  Says she has been on the same stable dose for quite a while.  Her lithium is apparently prescribed by a doctor in [place].  She thinks that her last blood level was checked back in October but does not remember exactly what it was.  Patient reports this morning that overnight she had some visual hallucinations.  At one point she thought that the door to her room was within her reach and found this strange given that she is in a stretcher about 10 feet away from it.  Saw few other odd things overnight but she is perfectly insightful about them and understands that they were visual illusions.  Denies auditory hallucinations.  Denies suicidal or homicidal thought.  Past  Psychiatric History: Patient has never been hospitalized.  Denies any history of suicide attempts or violence.  Was treated for depression and anxiety for years primarily with antidepressant medicines.  She reports that it was not until she was about 69 years old that she was diagnosed with bipolar disorder.  This diagnosis was made on the basis of an extended period of hyperactivity in which she was exercising to excess.  She thinks she may have had another medicine for bipolar but cannot remember what it is.  Records do not reflect any of those old medicines.  The only old records we have do show that she was being seen in 2014 for eating problems and depression.  No history of overdoses.  No previous history of toxicity.  Patient says that she has diabetes but had never in the past known herself to have any kidney problems.  Risk to Self:   Risk to Others:   Prior Inpatient Therapy:   Prior Outpatient Therapy:    Past Medical History:  Past Medical History:  Diagnosis Date  . Bipolar 1 disorder [facility])     Past Surgical History:  Procedure Laterality Date  . CESAREAN SECTION     Family History:  Family History  Family history unknown: Yes   Family Psychiatric  History: Does not know of any Social History:  Social History   Substance and Sexual Activity  Alcohol Use Never     Social History   Substance and Sexual Activity  Drug Use Never  Social History   Socioeconomic History  . Marital status: Single    Spouse name: Not on file  . Number of children: Not on file  . Years of education: Not on file  . Highest education level: Not on file  Occupational History  . Not on file  Tobacco Use  . Smoking status: Never Smoker  . Smokeless tobacco: Never Used  Vaping Use  . Vaping Use: Never used  Substance and Sexual Activity  . Alcohol use: Never  . Drug use: Never  . Sexual activity: Not on file  Other Topics Concern  . Not on file  Social History Narrative  . Not on  file   Social Determinants of Health   Financial Resource Strain: Not on file  Food Insecurity: Not on file  Transportation Needs: Not on file  Physical Activity: Not on file  Stress: Not on file  Social Connections: Not on file   Additional Social History:    Allergies:  No Known Allergies  Labs:  Results for orders placed or performed during the hospital encounter of 07/13/20 (from the past 48 hour(s))  Comprehensive metabolic panel     Status: Abnormal   Collection Time: 07/13/20 10:54 PM  Result Value Ref Range   Sodium 130 (L) 135 - 145 mmol/L   Potassium 3.5 3.5 - 5.1 mmol/L   Chloride 98 98 - 111 mmol/L   CO2 14 (L) 22 - 32 mmol/L   Glucose, Bld 167 (H) 70 - 99 mg/dL    Comment: Glucose reference range applies only to samples taken after fasting for at least 8 hours.   BUN 78 (H) 8 - 23 mg/dL   Creatinine, Ser 1.61 (H) 0.44 - 1.00 mg/dL   Calcium 9.5 8.9 - 09.6 mg/dL   Total Protein 8.4 (H) 6.5 - 8.1 g/dL   Albumin 4.4 3.5 - 5.0 g/dL   AST 12 (L) 15 - 41 U/L   ALT 17 0 - 44 U/L   Alkaline Phosphatase 150 (H) 38 - 126 U/L   Total Bilirubin 0.9 0.3 - 1.2 mg/dL   GFR, Estimated 6 (L) >60 mL/min    Comment: (NOTE) Calculated using the CKD-EPI Creatinine Equation (2021)    Anion gap 18 (H) 5 - 15    Comment: Performed at [facility], [address] [place], [place] [address]  CBC with Differential     Status: Abnormal   Collection Time: 07/13/20 10:54 PM  Result Value Ref Range   WBC 13.8 (H) 4.0 - 10.5 K/uL   RBC 4.09 3.87 - 5.11 MIL/uL   Hemoglobin 13.0 12.0 - 15.0 g/dL   HCT 98.1 19.1 - 47.8 %   MCV 93.4 80.0 - 100.0 fL   MCH 31.8 26.0 - 34.0 pg   MCHC 34.0 30.0 - 36.0 g/dL   RDW 29.5 62.1 - 30.8 %   Platelets 341 150 - 400 K/uL   nRBC 0.0 0.0 - 0.2 %   Neutrophils Relative % 74 %   Neutro Abs 10.2 (H) 1.7 - 7.7 K/uL   Lymphocytes Relative 16 %   Lymphs Abs 2.3 0.7 - 4.0 K/uL   Monocytes Relative 8 %   Monocytes Absolute 1.0 0.1 - 1.0 K/uL    Eosinophils Relative 1 %   Eosinophils Absolute 0.2 0.0 - 0.5 K/uL   Basophils Relative 0 %   Basophils Absolute 0.1 0.0 - 0.1 K/uL   Immature Granulocytes 1 %   Abs Immature Granulocytes 0.07 0.00 - 0.07  K/uL    Comment: Performed at [facility], [address] [place], [place] [address]  Lithium level     Status: Abnormal   Collection Time: 07/13/20 10:54 PM  Result Value Ref Range   Lithium Lvl 1.74 (HH) 0.60 - 1.20 mmol/L    Comment: CRITICAL RESULT CALLED TO, READ BACK BY AND VERIFIED WITH ASHTON PETERS AT 2339 ON 07/13/2020 [facility]. Performed at [facility], [address] [place], [place] [address]   Resp Panel by RT-PCR (Flu A&B, Covid) Nasopharyngeal Swab     Status: None   Collection Time: 07/14/20 12:26 AM   Specimen: Nasopharyngeal Swab; Nasopharyngeal(NP) swabs in vial transport medium  Result Value Ref Range   SARS Coronavirus 2 by RT PCR NEGATIVE NEGATIVE    Comment: (NOTE) SARS-CoV-2 target nucleic acids are NOT DETECTED.  The SARS-CoV-2 RNA is generally detectable in upper respiratory specimens during the acute phase of infection. The lowest concentration of SARS-CoV-2 viral copies this assay can detect is 138 copies/mL. A negative result does not preclude SARS-Cov-2 infection and should not be used as the sole basis for treatment or other patient management decisions. A negative result may occur with  improper specimen collection/handling, submission of specimen other than nasopharyngeal swab, presence of viral mutation(s) within the areas targeted by this assay, and inadequate number of viral copies(<138 copies/mL). A negative result must be combined with clinical observations, patient history, and epidemiological information. The expected result is Negative.  Fact Sheet for Patients:  BloggerCourse.com  Fact Sheet for Healthcare Providers:  SeriousBroker.it  This test is no t yet  approved or cleared by the [place] FDA and  has been authorized for detection and/or diagnosis of SARS-CoV-2 by FDA under an Emergency Use Authorization (EUA). This EUA will remain  in effect (meaning this test can be used) for the duration of the COVID-19 declaration under Section 564(b)(1) of the Act, 21 U.S.C.section 360bbb-3(b)(1), unless the authorization is terminated  or revoked sooner.       Influenza A by PCR NEGATIVE NEGATIVE   Influenza B by PCR NEGATIVE NEGATIVE    Comment: (NOTE) The Xpert Xpress SARS-CoV-2/FLU/RSV plus assay is intended as an aid in the diagnosis of influenza from Nasopharyngeal swab specimens and should not be used as a sole basis for treatment. Nasal washings and aspirates are unacceptable for Xpert Xpress SARS-CoV-2/FLU/RSV testing.  Fact Sheet for Patients: BloggerCourse.com  Fact Sheet for Healthcare Providers: SeriousBroker.it  This test is not yet approved or cleared by the [place] FDA and has been authorized for detection and/or diagnosis of SARS-CoV-2 by FDA under an Emergency Use Authorization (EUA). This EUA will remain in effect (meaning this test can be used) for the duration of the COVID-19 declaration under Section 564(b)(1) of the Act, 21 U.S.C. section 360bbb-3(b)(1), unless the authorization is terminated or revoked.  Performed at [facility], [address] [place], [place] [address]   Urinalysis, Complete w Microscopic Urine, Clean Catch     Status: Abnormal   Collection Time: 07/14/20 12:44 AM  Result Value Ref Range   Color, Urine YELLOW (A) YELLOW   APPearance HAZY (A) CLEAR   Specific Gravity, Urine 1.010 1.005 - 1.030   pH 5.0 5.0 - 8.0   Glucose, UA NEGATIVE NEGATIVE mg/dL   Hgb urine dipstick SMALL (A) NEGATIVE   Bilirubin Urine NEGATIVE NEGATIVE   Ketones, ur NEGATIVE NEGATIVE mg/dL   Protein, ur 829 (A) NEGATIVE mg/dL   Nitrite NEGATIVE  NEGATIVE  Leukocytes,Ua NEGATIVE NEGATIVE   RBC / HPF 0-5 0 - 5 RBC/hpf   WBC, UA 0-5 0 - 5 WBC/hpf   Bacteria, UA MANY (A) NONE SEEN   Squamous Epithelial / LPF 0-5 0 - 5   Mucus PRESENT     Comment: Performed at [facility], [address] [place], [place] [address]  Basic metabolic panel     Status: Abnormal   Collection Time: 07/14/20  1:23 AM  Result Value Ref Range   Sodium 131 (L) 135 - 145 mmol/L   Potassium 3.2 (L) 3.5 - 5.1 mmol/L   Chloride 100 98 - 111 mmol/L   CO2 17 (L) 22 - 32 mmol/L   Glucose, Bld 128 (H) 70 - 99 mg/dL    Comment: Glucose reference range applies only to samples taken after fasting for at least 8 hours.   BUN 77 (H) 8 - 23 mg/dL   Creatinine, Ser 6.046.23 (H) 0.44 - 1.00 mg/dL   Calcium 9.0 8.9 - 54.010.3 mg/dL   GFR, Estimated 7 (L) >60 mL/min    Comment: (NOTE) Calculated using the CKD-EPI Creatinine Equation (2021)    Anion gap 14 5 - 15    Comment: Performed at [facility], [address] [place], [place] [address]  HIV Antibody (routine testing w rflx)     Status: None   Collection Time: 07/14/20  1:36 AM  Result Value Ref Range   HIV Screen 4th Generation wRfx Non Reactive Non Reactive    Comment: Performed at [facility], [address] [place], [place] [address]  CBC     Status: Abnormal   Collection Time: 07/14/20  1:36 AM  Result Value Ref Range   WBC 11.2 (H) 4.0 - 10.5 K/uL   RBC 3.87 3.87 - 5.11 MIL/uL   Hemoglobin 12.5 12.0 - 15.0 g/dL   HCT 95.636.6 21.336.0 - 08.646.0 %   MCV 94.6 80.0 - 100.0 fL   MCH 32.3 26.0 - 34.0 pg   MCHC 34.2 30.0 - 36.0 g/dL   RDW 57.813.0 46.911.5 - 62.915.5 %   Platelets 295 150 - 400 K/uL   nRBC 0.0 0.0 - 0.2 %    Comment: Performed at [facility], [address] [place], [place] [address]  Salicylate level     Status: Abnormal   Collection Time: 07/14/20  1:36 AM  Result Value Ref Range   Salicylate Lvl <7.0 (L) 7.0 - 30.0 mg/dL    Comment: Performed at [facility],  [address] [place], [place] [address]  TSH     Status: None   Collection Time: 07/14/20  1:36 AM  Result Value Ref Range   TSH 1.650 0.350 - 4.500 uIU/mL    Comment: Performed by a 3rd Generation assay with a functional sensitivity of <=0.01 uIU/mL. Performed at [facility], [address] [place], [place] [address]   Beta-hydroxybutyric acid     Status: Abnormal   Collection Time: 07/14/20  1:36 AM  Result Value Ref Range   Beta-Hydroxybutyric Acid 1.13 (H) 0.05 - 0.27 mmol/L    Comment: Performed at [facility], [address] [place], [place] [address]  Lithium level     Status: Abnormal   Collection Time: 07/14/20  1:36 AM  Result Value Ref Range   Lithium Lvl 1.59 (HH) 0.60 - 1.20 mmol/L    Comment: CRITICAL VALUE NOTED. VALUE IS CONSISTENT WITH PREVIOUSLY REPORTED/CALLED VALUE...[facility] Performed at [facility], [phone]  [address] [place], [place] [address]   Acetaminophen level     Status: Abnormal   Collection Time: 07/14/20  1:36 AM  Result Value Ref Range   Acetaminophen (Tylenol), Serum <10 (L) 10 - 30 ug/mL    Comment: (NOTE) Therapeutic concentrations vary significantly. A range of 10-30 ug/mL  may be an effective concentration for many patients. However, some  are best treated at concentrations outside of this range. Acetaminophen concentrations >150 ug/mL at 4 hours after ingestion  and >50 ug/mL at 12 hours after ingestion are often associated with  toxic reactions.  Performed at [facility], [address] [place], [place] [address]   Basic metabolic panel     Status: Abnormal   Collection Time: 07/14/20  5:06 AM  Result Value Ref Range   Sodium 133 (L) 135 - 145 mmol/L   Potassium 3.2 (L) 3.5 - 5.1 mmol/L   Chloride 106 98 - 111 mmol/L   CO2 16 (L) 22 - 32 mmol/L   Glucose, Bld 137 (H) 70 - 99 mg/dL    Comment: Glucose reference range applies only to samples taken after fasting for at least 8 hours.   BUN 74  (H) 8 - 23 mg/dL   Creatinine, Ser 0.98 (H) 0.44 - 1.00 mg/dL   Calcium 8.1 (L) 8.9 - 10.3 mg/dL   GFR, Estimated 7 (L) >60 mL/min    Comment: (NOTE) Calculated using the CKD-EPI Creatinine Equation (2021)    Anion gap 11 5 - 15    Comment: Performed at [facility], [address] [place], [place] [address]  CBC     Status: Abnormal   Collection Time: 07/14/20  5:06 AM  Result Value Ref Range   WBC 10.3 4.0 - 10.5 K/uL   RBC 3.43 (L) 3.87 - 5.11 MIL/uL   Hemoglobin 11.0 (L) 12.0 - 15.0 g/dL   HCT 78.2 (L) 95.6 - 21.3 %   MCV 95.3 80.0 - 100.0 fL   MCH 32.1 26.0 - 34.0 pg   MCHC 33.6 30.0 - 36.0 g/dL   RDW 08.6 57.8 - 46.9 %   Platelets 255 150 - 400 K/uL   nRBC 0.0 0.0 - 0.2 %    Comment: Performed at [facility], [address] [place], [place] [address]  Lithium level     Status: Abnormal   Collection Time: 07/14/20  9:58 AM  Result Value Ref Range   Lithium Lvl 1.36 (H) 0.60 - 1.20 mmol/L    Comment: Performed at [facility], [address] [place], [place] [address]  CBG monitoring, ED     Status: Abnormal   Collection Time: 07/14/20  3:54 PM  Result Value Ref Range   Glucose-Capillary 154 (H) 70 - 99 mg/dL    Comment: Glucose reference range applies only to samples taken after fasting for at least 8 hours.  Lithium level     Status: None   Collection Time: 07/14/20  4:25 PM  Result Value Ref Range   Lithium Lvl 1.16 0.60 - 1.20 mmol/L    Comment: Performed at [facility], [address] [place], [place] [address]  Hemoglobin A1c     Status: Abnormal   Collection Time: 07/14/20  4:25 PM  Result Value Ref Range   Hgb A1c MFr Bld 7.2 (H) 4.8 - 5.6 %    Comment: (NOTE) Pre diabetes:          5.7%-6.4%  Diabetes:              >  6.4%  Glycemic control for   <7.0% adults with diabetes    Mean Plasma Glucose 159.94 mg/dL    Comment: Performed at [facility], [address], [place], [place] [address]  CBG  monitoring, ED     Status: Abnormal   Collection Time: 07/14/20  6:00 PM  Result Value Ref Range   Glucose-Capillary 159 (H) 70 - 99 mg/dL    Comment: Glucose reference range applies only to samples taken after fasting for at least 8 hours.  CBG monitoring, ED     Status: Abnormal   Collection Time: 07/14/20 11:54 PM  Result Value Ref Range   Glucose-Capillary 126 (H) 70 - 99 mg/dL    Comment: Glucose reference range applies only to samples taken after fasting for at least 8 hours.  Basic metabolic panel     Status: Abnormal   Collection Time: 07/15/20  5:11 AM  Result Value Ref Range   Sodium 141 135 - 145 mmol/L   Potassium 3.6 3.5 - 5.1 mmol/L   Chloride 116 (H) 98 - 111 mmol/L   CO2 17 (L) 22 - 32 mmol/L   Glucose, Bld 133 (H) 70 - 99 mg/dL    Comment: Glucose reference range applies only to samples taken after fasting for at least 8 hours.   BUN 52 (H) 8 - 23 mg/dL   Creatinine, Ser 4.09 (H) 0.44 - 1.00 mg/dL   Calcium 7.2 (L) 8.9 - 10.3 mg/dL   GFR, Estimated 13 (L) >60 mL/min    Comment: (NOTE) Calculated using the CKD-EPI Creatinine Equation (2021)    Anion gap 8 5 - 15    Comment: Performed at [facility], [address] [place], [place] [address]  CBC     Status: Abnormal   Collection Time: 07/15/20  5:11 AM  Result Value Ref Range   WBC 8.1 4.0 - 10.5 K/uL   RBC 3.62 (L) 3.87 - 5.11 MIL/uL   Hemoglobin 11.6 (L) 12.0 - 15.0 g/dL   HCT 47.8 (L) 29.5 - 62.1 %   MCV 97.8 80.0 - 100.0 fL   MCH 32.0 26.0 - 34.0 pg   MCHC 32.8 30.0 - 36.0 g/dL   RDW 30.8 65.7 - 84.6 %   Platelets 244 150 - 400 K/uL   nRBC 0.0 0.0 - 0.2 %    Comment: Performed at [facility], [address], [place], [place] [address]    Current Facility-Administered Medications  Medication Dose Route Frequency Provider Last Rate Last Admin  . 0.9 %  sodium chloride infusion   Intravenous Continuous Esaw Grandchild A, DO 250 mL/hr at 07/14/20 2031 New Bag at 07/14/20 2031  .  acetaminophen (TYLENOL) tablet 650 mg  650 mg Oral Q6H PRN Andris Baumann, MD       Or  . acetaminophen (TYLENOL) suppository 650 mg  650 mg Rectal Q6H PRN Andris Baumann, MD      . heparin injection 5,000 Units  5,000 Units Subcutaneous Q8H Andris Baumann, MD   5,000 Units at 07/15/20 [phone]  . insulin aspart (novoLOG) injection 0-9 Units  0-9 Units Subcutaneous TID WC Esaw Grandchild A, DO   2 Units at 07/14/20 1802  . ondansetron (ZOFRAN) tablet 4 mg  4 mg Oral Q6H PRN Andris Baumann, MD       Or  . ondansetron [facility]) injection 4 mg  4 mg Intravenous Q6H PRN Andris Baumann, MD   4 mg at 07/14/20 1626  .  potassium chloride SA (KLOR-CON) CR tablet 20 mEq  20 mEq Oral BID Mosetta Pigeon, MD   20 mEq at 07/14/20 2124   Current Outpatient Medications  Medication Sig Dispense Refill  . HYDROCORTISONE ACE, RECTAL, 30 MG SUPP Place 1 suppository rectally 2 (two) times daily as needed.    Marland Kitchen levothyroxine (SYNTHROID, LEVOTHROID) 100 MCG tablet Take 100 mcg by mouth daily before breakfast.    . lithium carbonate 300 MG capsule Take 3 capsules (900 mg total) by mouth 2 (two) times daily with a meal. (Patient taking differently: Take 300 mg by mouth 2 (two) times daily with a meal.) 10 capsule 0  . LORazepam (ATIVAN) 1 MG tablet Take 1 mg by mouth every 8 (eight) hours.    . mirtazapine (REMERON) 15 MG tablet Take 15 mg by mouth at bedtime.    . sertraline (ZOLOFT) 100 MG tablet Take 100 mg by mouth daily.    . sitaGLIPtin-metformin (JANUMET) 50-1000 MG tablet Take 1 tablet by mouth 2 (two) times daily with a meal.      Musculoskeletal: Strength & Muscle Tone: decreased Gait & Station: unable to stand Patient leans: N/A  Psychiatric Specialty Exam: Physical Exam Vitals and nursing note reviewed.  Constitutional:      Appearance: She is well-developed and well-nourished. She is ill-appearing.  HENT:     Head: Normocephalic and atraumatic.  Eyes:     Conjunctiva/sclera: Conjunctivae  normal.     Pupils: Pupils are equal, round, and reactive to light.  Cardiovascular:     Heart sounds: Normal heart sounds.  Pulmonary:     Effort: Pulmonary effort is normal.  Abdominal:     Palpations: Abdomen is soft.  Musculoskeletal:        General: Normal range of motion.     Cervical back: Normal range of motion.  Skin:    General: Skin is warm and dry.  Neurological:     Mental Status: She is alert.     Motor: Weakness present.  Psychiatric:        Attention and Perception: Attention normal.        Mood and Affect: Affect is blunt.        Speech: Speech is delayed.        Behavior: Behavior is cooperative.        Thought Content: Thought content normal.        Cognition and Memory: Memory is impaired.        Judgment: Judgment normal.     Review of Systems  Constitutional: Positive for appetite change and fatigue.  HENT: Negative.   Eyes: Negative.   Respiratory: Negative.   Cardiovascular: Negative.   Gastrointestinal: Positive for nausea.  Musculoskeletal: Negative.   Skin: Negative.   Neurological: Negative.   Psychiatric/Behavioral: Positive for confusion. Negative for suicidal ideas.    Blood pressure (!) 149/67, pulse 76, temperature 98 F (36.7 C), temperature source Oral, resp. rate 19, height 5\' 4"  (1.626 m), weight 77.1 kg, SpO2 98 %.Body mass index is 29.18 kg/m.  General Appearance: Casual  Eye Contact:  Fair  Speech:  Slow  Volume:  Decreased  Mood:  Dysphoric  Affect:  Constricted  Thought Process:  Coherent  Orientation:  Full (Time, Place, and Person)  Thought Content:  Logical  Suicidal Thoughts:  No  Homicidal Thoughts:  No  Memory:  Immediate;   Fair Recent;   Fair Remote;   Fair  Judgement:  Fair  Insight:  Fair  Psychomotor Activity:  Decreased  Concentration:  Concentration: Fair  Recall:  [employer] of Knowledge:  Fair  Language:  Fair  Akathisia:  No  Handed:  Right  AIMS (if indicated):     Assets:  Desire for  Improvement Housing Resilience  ADL's:  Impaired  Cognition:  Impaired,  Mild  Sleep:        Treatment Plan Summary: Daily contact with patient to assess and evaluate symptoms and progress in treatment, Medication management and Plan 69 year old woman who presents with renal insufficiency and elevated lithium level along with symptoms that are consistent with lithium toxicity.  No evidence that there was any intentionality to overdosing.  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  As result the lithium level starts to creep up and at some point may even itself be a contributor to the kidney problems.  Usually in these cases the total body load of lithium is built up pretty high because of the slow accumulation.  There is no reason to restart any lithium at this point.  Not clear to me at this time whether she would be a candidate for hemodialysis or just hydration and monitoring.  In either case does not need to start any psychiatric medicines.  The visual illusions she had overnight were typical of delirium from sickness and hospitalization.  They do not represent her mental health problems and do not require any specific treatment unless things worsen to where she has behavior issues.  Recommend regular checks of her lithium level.  I would suspect it to come down fairly slowly.  Once she is feeling much better we can reassess appropriate psychiatric medicine.  Patient agrees to the entirety of this plan.  I will follow-up as needed in the hospital.  Disposition: No evidence of imminent risk to self or others at present.   Patient does not meet criteria for psychiatric inpatient admission. Supportive therapy provided about ongoing stressors. Discussed crisis plan, support from social network, calling 911, coming to the Emergency Department, and calling Suicide Hotline.  Mordecai Rasmussen, MD 07/15/2020 12:25 PM

## 2020-07-16 LAB — BASIC METABOLIC PANEL
Anion gap: 10 (ref 5–15)
BUN: 35 mg/dL — ABNORMAL HIGH (ref 8–23)
CO2: 14 mmol/L — ABNORMAL LOW (ref 22–32)
Calcium: 7.4 mg/dL — ABNORMAL LOW (ref 8.9–10.3)
Chloride: 117 mmol/L — ABNORMAL HIGH (ref 98–111)
Creatinine, Ser: 2.62 mg/dL — ABNORMAL HIGH (ref 0.44–1.00)
GFR, Estimated: 19 mL/min — ABNORMAL LOW (ref >60)
Glucose, Bld: 127 mg/dL — ABNORMAL HIGH (ref 70–99)
Potassium: 4 mmol/L (ref 3.5–5.1)
Sodium: 141 mmol/L (ref 135–145)

## 2020-07-16 LAB — MAGNESIUM: Magnesium: 1.4 mg/dL — ABNORMAL LOW (ref 1.7–2.4)

## 2020-07-16 LAB — GLUCOSE, CAPILLARY
Glucose-Capillary: 126 mg/dL — ABNORMAL HIGH (ref 70–99)
Glucose-Capillary: 131 mg/dL — ABNORMAL HIGH (ref 70–99)
Glucose-Capillary: 165 mg/dL — ABNORMAL HIGH (ref 70–99)
Glucose-Capillary: 129 mg/dL — ABNORMAL HIGH (ref 70–99)

## 2020-07-16 LAB — LITHIUM LEVEL: Lithium Lvl: 0.59 mmol/L — ABNORMAL LOW (ref 0.60–1.20)

## 2020-07-16 LAB — ALBUMIN: Albumin: 3.2 g/dL — ABNORMAL LOW (ref 3.5–5.0)

## 2020-07-16 MED ORDER — ENSURE ENLIVE PO LIQD
237.0000 mL | Freq: Two times a day (BID) | ORAL | Status: DC
  Administered 2020-07-16 – 2020-07-19 (×4): 237 mL via ORAL

## 2020-07-16 MED ORDER — ADULT MULTIVITAMIN W/MINERALS CH
1.0000 | ORAL_TABLET | Freq: Every day | ORAL | Status: DC
  Administered 2020-07-17 – 2020-07-19 (×3): 1 via ORAL
  Filled 2020-07-16 (×3): qty 1

## 2020-07-16 MED ORDER — LORAZEPAM 1 MG PO TABS
1.0000 mg | ORAL_TABLET | Freq: Three times a day (TID) | ORAL | Status: DC
  Administered 2020-07-16 – 2020-07-19 (×10): 1 mg via ORAL
  Filled 2020-07-16 (×10): qty 1

## 2020-07-16 MED ORDER — SERTRALINE HCL 50 MG PO TABS
100.0000 mg | ORAL_TABLET | Freq: Every day | ORAL | Status: DC
  Administered 2020-07-16 – 2020-07-19 (×4): 100 mg via ORAL
  Filled 2020-07-16 (×4): qty 2

## 2020-07-16 MED ORDER — MAGNESIUM SULFATE 4 GM/100ML IV SOLN
4.0000 g | Freq: Once | INTRAVENOUS | Status: AC
  Administered 2020-07-16: 13:00:00 4 g via INTRAVENOUS
  Filled 2020-07-16: qty 100

## 2020-07-16 MED ORDER — LEVOTHYROXINE SODIUM 100 MCG PO TABS
100.0000 ug | ORAL_TABLET | Freq: Every day | ORAL | Status: DC
  Administered 2020-07-17 – 2020-07-19 (×3): 100 ug via ORAL
  Filled 2020-07-16 (×3): qty 1

## 2020-07-16 MED ORDER — STERILE WATER FOR INJECTION IV SOLN
INTRAVENOUS | Status: DC
  Filled 2020-07-16: qty 150
  Filled 2020-07-16 (×3): qty 850
  Filled 2020-07-16: qty 150
  Filled 2020-07-16 (×3): qty 850
  Filled 2020-07-16: qty 150

## 2020-07-16 MED ORDER — MIRTAZAPINE 15 MG PO TABS
15.0000 mg | ORAL_TABLET | Freq: Every day | ORAL | Status: DC
  Administered 2020-07-16 – 2020-07-18 (×3): 15 mg via ORAL
  Filled 2020-07-16 (×3): qty 1

## 2020-07-16 NOTE — Consult Note (Signed)
[facility] Face-to-Face Psychiatry Consult   Reason for Consult: Follow-up consult patient who presented with elevated lithium level Referring Physician: Denton Lank Patient Identification: Jasmine Simmons MRN:  [id number] Principal Diagnosis: Lithium toxicity Diagnosis:  Principal Problem:   Lithium toxicity Active Problems:   AKI (acute kidney injury) (HCC)   High anion gap metabolic acidosis   Bipolar disorder, unspecified (HCC)   Hypothyroidism   Total Time spent with patient: 20 minutes  Subjective:   Jasmine Simmons is a 69 y.o. female patient admitted with "I am feeling better".  HPI: See note from yesterday.  69 year old woman who presented with physical symptoms consistent with lithium toxicity and was found to have an elevated lithium level as well as renal insufficiency.  No evidence whatsoever of any intentional overdose.  Recommendation was to hold lithium.  Labs show the creatinine is improving gradually and that her lithium level also is improving gradually down to 0.59 at the last check.  Patient says she is feeling much better today.  Denies having any hallucinations.  Denies any mood symptoms.  She still looks like she is feeling fatigued and a little sick to her stomach and was taking a nap in the afternoon but affect is euthymic  Past Psychiatric History: Past history of bipolar disorder and longstanding treatment with lithium.  No previous hospitalizations  Risk to Self:   Risk to Others:   Prior Inpatient Therapy:   Prior Outpatient Therapy:    Past Medical History:  Past Medical History:  Diagnosis Date   Bipolar 1 disorder (HCC)     Past Surgical History:  Procedure Laterality Date   CESAREAN SECTION     Family History:  Family History  Family history unknown: Yes   Family Psychiatric  History: See previous Social History:  Social History   Substance and Sexual Activity  Alcohol Use Never     Social History   Substance and Sexual Activity  Drug Use  Never    Social History   Socioeconomic History   Marital status: Single    Spouse name: Not on file   Number of children: Not on file   Years of education: Not on file   Highest education level: Not on file  Occupational History   Not on file  Tobacco Use   Smoking status: Never Smoker   Smokeless tobacco: Never Used  Vaping Use   Vaping Use: Never used  Substance and Sexual Activity   Alcohol use: Never   Drug use: Never   Sexual activity: Not on file  Other Topics Concern   Not on file  Social History Narrative   Not on file   Social Determinants of Health   Financial Resource Strain: Not on file  Food Insecurity: Not on file  Transportation Needs: Not on file  Physical Activity: Not on file  Stress: Not on file  Social Connections: Not on file   Additional Social History:    Allergies:  No Known Allergies  Labs:  Results for orders placed or performed during the hospital encounter of 07/13/20 (from the past 48 hour(s))  CBG monitoring, ED     Status: Abnormal   Collection Time: 07/14/20  3:54 PM  Result Value Ref Range   Glucose-Capillary 154 (H) 70 - 99 mg/dL    Comment: Glucose reference range applies only to samples taken after fasting for at least 8 hours.  Lithium level     Status: None   Collection Time: 07/14/20  4:25 PM  Result Value Ref  Range   Lithium Lvl 1.16 0.60 - 1.20 mmol/L    Comment: Performed at [facility], [address] [place], [place] [address]  Hemoglobin A1c     Status: Abnormal   Collection Time: 07/14/20  4:25 PM  Result Value Ref Range   Hgb A1c MFr Bld 7.2 (H) 4.8 - 5.6 %    Comment: (NOTE) Pre diabetes:          5.7%-6.4%  Diabetes:              >6.4%  Glycemic control for   <7.0% adults with diabetes    Mean Plasma Glucose 159.94 mg/dL    Comment: Performed at [facility], [address], [place], [place] [address]  CBG monitoring, ED     Status: Abnormal   Collection Time: 07/14/20   6:00 PM  Result Value Ref Range   Glucose-Capillary 159 (H) 70 - 99 mg/dL    Comment: Glucose reference range applies only to samples taken after fasting for at least 8 hours.  CBG monitoring, ED     Status: Abnormal   Collection Time: 07/14/20 11:54 PM  Result Value Ref Range   Glucose-Capillary 126 (H) 70 - 99 mg/dL    Comment: Glucose reference range applies only to samples taken after fasting for at least 8 hours.  Basic metabolic panel     Status: Abnormal   Collection Time: 07/15/20  5:11 AM  Result Value Ref Range   Sodium 141 135 - 145 mmol/L   Potassium 3.6 3.5 - 5.1 mmol/L   Chloride 116 (H) 98 - 111 mmol/L   CO2 17 (L) 22 - 32 mmol/L   Glucose, Bld 133 (H) 70 - 99 mg/dL    Comment: Glucose reference range applies only to samples taken after fasting for at least 8 hours.   BUN 52 (H) 8 - 23 mg/dL   Creatinine, Ser 7.56 (H) 0.44 - 1.00 mg/dL   Calcium 7.2 (L) 8.9 - 10.3 mg/dL   GFR, Estimated 13 (L) >60 mL/min    Comment: (NOTE) Calculated using the CKD-EPI Creatinine Equation (2021)    Anion gap 8 5 - 15    Comment: Performed at [facility], [address] [place], [place] [address]  CBC     Status: Abnormal   Collection Time: 07/15/20  5:11 AM  Result Value Ref Range   WBC 8.1 4.0 - 10.5 K/uL   RBC 3.62 (L) 3.87 - 5.11 MIL/uL   Hemoglobin 11.6 (L) 12.0 - 15.0 g/dL   HCT 51.8 (L) 84.1 - 66.0 %   MCV 97.8 80.0 - 100.0 fL   MCH 32.0 26.0 - 34.0 pg   MCHC 32.8 30.0 - 36.0 g/dL   RDW 63.0 16.0 - 10.9 %   Platelets 244 150 - 400 K/uL   nRBC 0.0 0.0 - 0.2 %    Comment: Performed at [facility], [address] [place], [place] [address]  CBG monitoring, ED     Status: Abnormal   Collection Time: 07/15/20  4:28 PM  Result Value Ref Range   Glucose-Capillary 118 (H) 70 - 99 mg/dL    Comment: Glucose reference range applies only to samples taken after fasting for at least 8 hours.  Glucose, capillary     Status: Abnormal   Collection Time:  07/15/20  8:37 PM  Result Value Ref Range   Glucose-Capillary 151 (H) 70 - 99 mg/dL    Comment: Glucose reference range applies  only to samples taken after fasting for at least 8 hours.  Basic metabolic panel     Status: Abnormal   Collection Time: 07/16/20  4:24 AM  Result Value Ref Range   Sodium 141 135 - 145 mmol/L   Potassium 4.0 3.5 - 5.1 mmol/L   Chloride 117 (H) 98 - 111 mmol/L   CO2 14 (L) 22 - 32 mmol/L   Glucose, Bld 127 (H) 70 - 99 mg/dL    Comment: Glucose reference range applies only to samples taken after fasting for at least 8 hours.   BUN 35 (H) 8 - 23 mg/dL   Creatinine, Ser 4.092.62 (H) 0.44 - 1.00 mg/dL   Calcium 7.4 (L) 8.9 - 10.3 mg/dL   GFR, Estimated 19 (L) >60 mL/min    Comment: (NOTE) Calculated using the CKD-EPI Creatinine Equation (2021)    Anion gap 10 5 - 15    Comment: Performed at [facility], [address] [place], [place] [address]  Magnesium     Status: Abnormal   Collection Time: 07/16/20  4:24 AM  Result Value Ref Range   Magnesium 1.4 (L) 1.7 - 2.4 mg/dL    Comment: Performed at [facility], [address] [place], [place] [address]  Glucose, capillary     Status: Abnormal   Collection Time: 07/16/20  8:03 AM  Result Value Ref Range   Glucose-Capillary 126 (H) 70 - 99 mg/dL    Comment: Glucose reference range applies only to samples taken after fasting for at least 8 hours.  Albumin     Status: Abnormal   Collection Time: 07/16/20  8:38 AM  Result Value Ref Range   Albumin 3.2 (L) 3.5 - 5.0 g/dL    Comment: Performed at [facility], [address] [place], [place] [address]  Lithium level     Status: Abnormal   Collection Time: 07/16/20  8:38 AM  Result Value Ref Range   Lithium Lvl 0.59 (L) 0.60 - 1.20 mmol/L    Comment: Performed at [facility], [address] [place], [place] [address]  Glucose, capillary     Status: Abnormal   Collection Time: 07/16/20 11:50 AM  Result Value Ref Range    Glucose-Capillary 131 (H) 70 - 99 mg/dL    Comment: Glucose reference range applies only to samples taken after fasting for at least 8 hours.    Current Facility-Administered Medications  Medication Dose Route Frequency Provider Last Rate Last Admin   acetaminophen (TYLENOL) tablet 650 mg  650 mg Oral Q6H PRN Andris Baumannuncan, Hazel V, MD       Or   acetaminophen (TYLENOL) suppository 650 mg  650 mg Rectal Q6H PRN Andris Baumannuncan, Hazel V, MD       feeding supplement (ENSURE ENLIVE / ENSURE PLUS) liquid 237 mL  237 mL Oral BID BM Esaw GrandchildGriffith, Kelly A, DO       heparin injection 5,000 Units  5,000 Units Subcutaneous Q8H Lindajo Royaluncan, Hazel V, MD   5,000 Units at 07/16/20 1314   insulin aspart (novoLOG) injection 0-9 Units  0-9 Units Subcutaneous TID WC Esaw GrandchildGriffith, Kelly A, DO   1 Units at 07/16/20 1313   [START ON 07/17/2020] levothyroxine (SYNTHROID) tablet 100 mcg  100 mcg Oral QAC breakfast Esaw GrandchildGriffith, Kelly A, DO       LORazepam (ATIVAN) tablet 1 mg  1 mg Oral Q8H Griffith, Kelly A, DO   1 mg at 07/16/20 1313   mirtazapine (REMERON) tablet 15 mg  15  mg Oral QHS Pennie Banter, DO       [START ON 07/17/2020] multivitamin with minerals tablet 1 tablet  1 tablet Oral Daily Esaw Grandchild A, DO       ondansetron [facility]) tablet 4 mg  4 mg Oral Q6H PRN Andris Baumann, MD       Or   ondansetron [facility]) injection 4 mg  4 mg Intravenous Q6H PRN Andris Baumann, MD   4 mg at 07/14/20 1626   potassium chloride SA (KLOR-CON) CR tablet 20 mEq  20 mEq Oral BID Mosetta Pigeon, MD   20 mEq at 07/16/20 0815   sertraline (ZOLOFT) tablet 100 mg  100 mg Oral Daily Esaw Grandchild A, DO   100 mg at 07/16/20 1313   sodium bicarbonate 150 mEq in sterile water 1,000 mL infusion   Intravenous Continuous Lateef, Munsoor, MD        Musculoskeletal: Strength & Muscle Tone: decreased Gait & Station: unsteady Patient leans: N/A  Psychiatric Specialty Exam: Physical Exam Vitals and nursing note reviewed.  Constitutional:       Appearance: She is well-developed and well-nourished.  HENT:     Head: Normocephalic and atraumatic.  Eyes:     Conjunctiva/sclera: Conjunctivae normal.     Pupils: Pupils are equal, round, and reactive to light.  Cardiovascular:     Heart sounds: Normal heart sounds.  Pulmonary:     Effort: Pulmonary effort is normal.  Abdominal:     Palpations: Abdomen is soft.  Musculoskeletal:        General: Normal range of motion.     Cervical back: Normal range of motion.  Skin:    General: Skin is warm and dry.  Neurological:     General: No focal deficit present.     Mental Status: She is alert.  Psychiatric:        Attention and Perception: Attention normal.        Mood and Affect: Mood normal.        Speech: Speech is delayed.        Behavior: Behavior is slowed.        Thought Content: Thought content is not paranoid or delusional. Thought content does not include homicidal or suicidal ideation.        Cognition and Memory: Cognition normal.        Judgment: Judgment normal.     Review of Systems  Constitutional: Negative.   HENT: Negative.   Eyes: Negative.   Respiratory: Negative.   Cardiovascular: Negative.   Gastrointestinal: Negative.   Musculoskeletal: Negative.   Skin: Negative.   Neurological: Negative.   Psychiatric/Behavioral: Negative.     Blood pressure 127/85, pulse 83, temperature (!) 97.5 F (36.4 C), temperature source Oral, resp. rate 20, height  (1.626 m), weight 74 kg, SpO2 99 %.Body mass index is 28.01 kg/m.  General Appearance: Casual  Eye Contact:  Fair  Speech:  Slow  Volume:  Decreased  Mood:  Euthymic  Affect:  Constricted  Thought Process:  Goal Directed  Orientation:  Full (Time, Place, and Person)  Thought Content:  Logical  Suicidal Thoughts:  No  Homicidal Thoughts:  No  Memory:  Immediate;   Fair Recent;   Fair Remote;   Fair  Judgement:  Fair  Insight:  Fair  Psychomotor Activity:  Normal  Concentration:   Concentration: Fair  Recall:  [employer] of Knowledge:  Fair  Language:  Fair  Akathisia:  No  Handed:  Right  AIMS (if indicated):     Assets:  Resilience  ADL's:  Impaired  Cognition:  Impaired,  Mild  Sleep:        Treatment Plan Summary: Medication management and Plan Patient is feeling better.  No acute psychiatric symptoms.  My understanding of her situation is that she was not someone for whom lithium was necessarily a "magic bullet", and so I am probably going to recommend not restarting it at all even when she is ready for discharge.  I will speak with her at that point and we could try another option for treating bipolar disorder or she can just follow-up with an outpatient provider.  Thanks.  Disposition: No evidence of imminent risk to self or others at present.   Patient does not meet criteria for psychiatric inpatient admission. Supportive therapy provided about ongoing stressors.  Mordecai Rasmussen, MD 07/16/2020 3:29 PM

## 2020-07-16 NOTE — Progress Notes (Signed)
PROGRESS NOTE    Toniya Rozar   POE:[id number]  DOB: 1951/03/29  PCP: Patient, No Pcp Per    DOA: 07/13/2020 LOS: 2   Brief Narrative   Jasmine Simmons is a 69 y.o. female with medical history significant for hypothyroidism, chronic diarrhea and bipolar disorder on lithium with history of hospitalization in 2020 lithium toxicity who presented to the ER with a complaint of slowed thinking/brain fog and tremors for about a week, similar to when she had lithium toxicity a year ago.  She had been taking her usual dose of lithium, however had been having nausea and vomiting in addition to her baseline chronic diarrhea.  Evaluation in the ED showed acute renal failure with elevated lithium level 1.73, creatinine 6.53 (baseline 1.06-year ago), associated anion gap metabolic acidosis, leukocytosis 13k.  EKG was normal sinus rhythm with mild QTC prolongation of 464.  Poison control was contacted and recommended IV hydration and serial lithium levels.  Patient was admitted to hospitalist service for management of lithium toxicity likely due to dehydration and associated acute kidney injury     Assessment & Plan   Principal Problem:   Lithium toxicity Active Problems:   AKI (acute kidney injury) (HCC)   High anion gap metabolic acidosis   Bipolar disorder, unspecified (HCC)   Hypothyroidism   Lithium toxicity - due to severe AKI and poor clearance.  Pt compliant with prescribed dose. --12/26 Per poison control, IV fluids at 250 cc/h and trend lithium levels every 6 hour --12/27 lithium level has normalized --psych consult, recs appreciated  Acute kidney injury - likely hypovolemic given N/V on top of chronic diarrhea.   Renal U/S negative for hydronephrosis to suggest obstructive uropathy. Cr trending down with IV fluids. --Continue IV hydration --Nephrology following and switched fluids to sodium bicarb today  Hypomagnesemia - Mg 1.4 this morning, replaced.  Monitor and replace as  needed.  Nausea/vomiting - POA, improving.  PRN antiemetics.    Hyponatremia - mild, Na 130 on admission, likely due to N/V/D and hypovolemia.   Resolved with IV fluids.   --Monitor BMP  Chronic diarrhea - at baseline per pt.  Contributing factor to hypovolemia and AKI along with concurrent N/V.   Anion gap metabolic acidosis - resolved.  POA due to renal failure.  Bipolar disorder - with lithium toxicity on admission which was due to acute renal failure.  Psych consulted. 12/27 - pt reports hallucinations last night, she states typical for her without lithium  Hypothyroidism - on levothyroxine.  TSH within normal 1.650.     Patient BMI: Body mass index is 28.01 kg/m.   DVT prophylaxis: heparin injection 5,000 Units Start: 07/14/20 0600   Diet:  Diet Orders (From admission, onward)    Start     Ordered   07/14/20 0134  Diet regular Room service appropriate? Yes; Fluid consistency: Thin  Diet effective now       Question Answer Comment  Room service appropriate? Yes   Fluid consistency: Thin      07/14/20 0137            Code Status: Full Code    Subjective 07/16/20    Pt seen awake resting in bed this morning.  She reports feeling better.  States the brain fog she been having has resolved.  Nausea and vomiting also better and now tolerating diet well.  She denies hallucinations overnight or this morning.  States that she lives here in the winter and is from [place].  [place] in [place].    Disposition Plan & Communication   Status is: Inpatient  Inpatient status remains appropriate due to severity of illness with acute renal failure requiring IV fluids.  Dispo: The patient is from: home              Anticipated d/c is to: home              Anticipated d/c date is: 2 days              Medically stable for d/c: No   Family Communication: close friend update on rounds today   Consults, Procedures, Significant Events   Consultants:    Psychiatry  Procedures:   None  Antimicrobials:  Anti-infectives (From admission, onward)   None       Objective   Vitals:   07/16/20 0428 07/16/20 0808 07/16/20 1148 07/16/20 1641  BP: (!) 147/72 (!) 156/78 127/85 (!) 152/80  Pulse: 86 86 83 84  Resp: 17 19 20 19   Temp: 98 F (36.7 C) (!) 97.4 F (36.3 C) (!) 97.5 F (36.4 C) 98.4 F (36.9 C)  TempSrc: Oral Oral Oral Oral  SpO2: 96% 97% 99% 98%  Weight: 74 kg     Height:        Intake/Output Summary (Last 24 hours) at 07/16/2020 1732 Last data filed at 07/16/2020 1029 Gross per 24 hour  Intake 4942.09 ml  Output 1100 ml  Net 3842.09 ml   Filed Weights   07/13/20 2247 07/16/20 0243 07/16/20 0428  Weight: 77.1 kg 74 kg 74 kg    Physical Exam:  General exam: awake, alert, no acute distress Respiratory system: CTAB, no wheezes, rales or rhonchi, normal respiratory effort. Cardiovascular system: normal S1/S2, RRR, no pedal edema.   Gastrointestinal system: soft, NT, ND, hyperactive bowel sounds Central nervous system: A&O x3. no gross focal neurologic deficits, normal speech Psychiatry: normal mood, flat affect, judgement and insight appear normal, no apparent hallucinations  Labs   Data Reviewed: I have personally reviewed following labs and imaging studies  CBC: Recent Labs  Lab 07/13/20 2254 07/14/20 0136 07/14/20 0506 07/15/20 0511  WBC 13.8* 11.2* 10.3 8.1  NEUTROABS 10.2*  --   --   --   HGB 13.0 12.5 11.0* 11.6*  HCT 38.2 36.6 32.7* 35.4*  MCV 93.4 94.6 95.3 97.8  PLT 341 295 255 244   Basic Metabolic Panel: Recent Labs  Lab 07/13/20 2254 07/14/20 0123 07/14/20 0506 07/15/20 0511 07/16/20 0424  NA 130* 131* 133* 141 141  K 3.5 3.2* 3.2* 3.6 4.0  CL 98 100 106 116* 117*  CO2 14* 17* 16* 17* 14*  GLUCOSE 167* 128* 137* 133* 127*  BUN 78* 77* 74* 52* 35*  CREATININE 6.53* 6.23* 5.97* 3.55* 2.62*  CALCIUM 9.5 9.0 8.1* 7.2* 7.4*  MG  --   --   --   --  1.4*   GFR: Estimated  Creatinine Clearance: 20.2 mL/min (A) (by C-G formula based on SCr of 2.62 mg/dL (H)). Liver Function Tests: Recent Labs  Lab 07/13/20 2254 07/16/20 0838  AST 12*  --   ALT 17  --   ALKPHOS 150*  --   BILITOT 0.9  --   PROT 8.4*  --   ALBUMIN 4.4 3.2*   No results for input(s): LIPASE, AMYLASE in the last 168 hours. No results for input(s): AMMONIA in the last 168 hours. Coagulation Profile: No results for input(s): INR, PROTIME in the last  168 hours. Cardiac Enzymes: No results for input(s): CKTOTAL, CKMB, CKMBINDEX, TROPONINI in the last 168 hours. BNP (last 3 results) No results for input(s): PROBNP in the last 8760 hours. HbA1C: Recent Labs    07/14/20 1625  HGBA1C 7.2*   CBG: Recent Labs  Lab 07/15/20 1628 07/15/20 2037 07/16/20 0803 07/16/20 1150 07/16/20 1637  GLUCAP 118* 151* 126* 131* 165*   Lipid Profile: No results for input(s): CHOL, HDL, LDLCALC, TRIG, CHOLHDL, LDLDIRECT in the last 72 hours. Thyroid Function Tests: Recent Labs    07/14/20 0136  TSH 1.650   Anemia Panel: No results for input(s): VITAMINB12, FOLATE, FERRITIN, TIBC, IRON, RETICCTPCT in the last 72 hours. Sepsis Labs: No results for input(s): PROCALCITON, LATICACIDVEN in the last 168 hours.  Recent Results (from the past 240 hour(s))  Resp Panel by RT-PCR (Flu A&B, Covid) Nasopharyngeal Swab     Status: None   Collection Time: 07/14/20 12:26 AM   Specimen: Nasopharyngeal Swab; Nasopharyngeal(NP) swabs in vial transport medium  Result Value Ref Range Status   SARS Coronavirus 2 by RT PCR NEGATIVE NEGATIVE Final    Comment: (NOTE) SARS-CoV-2 target nucleic acids are NOT DETECTED.  The SARS-CoV-2 RNA is generally detectable in upper respiratory specimens during the acute phase of infection. The lowest concentration of SARS-CoV-2 viral copies this assay can detect is 138 copies/mL. A negative result does not preclude SARS-Cov-2 infection and should not be used as the sole basis for  treatment or other patient management decisions. A negative result may occur with  improper specimen collection/handling, submission of specimen other than nasopharyngeal swab, presence of viral mutation(s) within the areas targeted by this assay, and inadequate number of viral copies(<138 copies/mL). A negative result must be combined with clinical observations, patient history, and epidemiological information. The expected result is Negative.  Fact Sheet for Patients:  BloggerCourse.com  Fact Sheet for Healthcare Providers:  SeriousBroker.it  This test is no t yet approved or cleared by the [place] FDA and  has been authorized for detection and/or diagnosis of SARS-CoV-2 by FDA under an Emergency Use Authorization (EUA). This EUA will remain  in effect (meaning this test can be used) for the duration of the COVID-19 declaration under Section 564(b)(1) of the Act, 21 U.S.C.section 360bbb-3(b)(1), unless the authorization is terminated  or revoked sooner.       Influenza A by PCR NEGATIVE NEGATIVE Final   Influenza B by PCR NEGATIVE NEGATIVE Final    Comment: (NOTE) The Xpert Xpress SARS-CoV-2/FLU/RSV plus assay is intended as an aid in the diagnosis of influenza from Nasopharyngeal swab specimens and should not be used as a sole basis for treatment. Nasal washings and aspirates are unacceptable for Xpert Xpress SARS-CoV-2/FLU/RSV testing.  Fact Sheet for Patients: BloggerCourse.com  Fact Sheet for Healthcare Providers: SeriousBroker.it  This test is not yet approved or cleared by the [place] FDA and has been authorized for detection and/or diagnosis of SARS-CoV-2 by FDA under an Emergency Use Authorization (EUA). This EUA will remain in effect (meaning this test can be used) for the duration of the COVID-19 declaration under Section 564(b)(1) of the Act, 21  U.S.C. section 360bbb-3(b)(1), unless the authorization is terminated or revoked.  Performed at [facility], [address], [place], [place] [address]       Imaging Studies   No results found.   Medications   Scheduled Meds: . feeding supplement  237 mL Oral BID BM  . heparin  5,000 Units Subcutaneous Q8H  .  insulin aspart  0-9 Units Subcutaneous TID WC  . [START ON 07/17/2020] levothyroxine  100 mcg Oral QAC breakfast  . LORazepam  1 mg Oral Q8H  . mirtazapine  15 mg Oral QHS  . [START ON 07/17/2020] multivitamin with minerals  1 tablet Oral Daily  . potassium chloride  20 mEq Oral BID  . sertraline  100 mg Oral Daily   Continuous Infusions: .  sodium bicarbonate (isotonic) infusion in sterile water 100 mL/hr at 07/16/20 1720       LOS: 2 days    Time spent: 25 minutes with greater than 50% spent at bedside and coronation of care    Pennie Banter, DO [facility]  07/16/2020, 5:32 PM    If 7PM-7AM, please contact night-coverage. How to contact the [facility] Attending or Consulting provider 7A - 7P or covering provider during after hours 7P -7A, for this patient?    1. Check the care team in [facility] and look for a) attending/consulting [facility] provider listed and b) the [facility] team listed 2. Log into [email or web address] and use [facility] universal password to access. If you do not have the password, please contact the hospital operator. 3. Locate the [facility] provider you are looking for under [facility] and page to a number that you can be directly reached. 4. If you still have difficulty reaching the provider, please page the [facility] : [facility] (Director on Call) for the Hospitalists listed on amion for assistance.

## 2020-07-16 NOTE — Progress Notes (Signed)
Mobility Specialist - Progress Note   07/16/20 1132  Mobility  Activity Refused mobility  Mobility performed by Mobility specialist    Pt refused session at this time. States "I just want to sleep". Will hold and re-attempt at a later date/time.    Ciarah Peace Mobility Specialist  07/16/20, 11:33 AM

## 2020-07-16 NOTE — Progress Notes (Signed)
[facility]  ROUNDING NOTE   Subjective:  Renal function has improved today. Creatinine down to 2.6.   Objective:  Vital signs in last 24 hours:  Temp:  [97.4 F (36.3 C)-98 F (36.7 C)] 97.4 F (36.3 C) (12/28 0808) Pulse Rate:  [71-86] 86 (12/28 0808) Resp:  [14-22] 19 (12/28 0808) BP: (111-156)/(52-83) 156/78 (12/28 0808) SpO2:  [96 %-100 %] 97 % (12/28 0808) Weight:  [74 kg] 74 kg (12/28 0428)  Weight change:  Filed Weights   07/13/20 2247 07/16/20 0243 07/16/20 0428  Weight: 77.1 kg 74 kg 74 kg    Intake/Output: I/O last 3 completed shifts: In: 4942.1 [P.O.:240; I.V.:4702.1] Out: 1000 [Urine:1000]   Intake/Output this shift:  Total I/O In: -  Out: 100 [Urine:100]  Physical Exam: General:  No acute distress  Head:  Normocephalic, atraumatic. Moist oral mucosal membranes  Eyes:  Anicteric  Neck:  Supple  Lungs:   Clear to auscultation, normal effort  Heart:  S1S2 no rubs  Abdomen:   Soft, nontender, bowel sounds present  Extremities:  No peripheral edema.  Neurologic:  Awake, alert, following commands  Skin:  No lesions  Access:  No dialysis access    Basic Metabolic Panel: Recent Labs  Lab 07/13/20 2254 07/14/20 0123 07/14/20 0506 07/15/20 0511 07/16/20 0424  NA 130* 131* 133* 141 141  K 3.5 3.2* 3.2* 3.6 4.0  CL 98 100 106 116* 117*  CO2 14* 17* 16* 17* 14*  GLUCOSE 167* 128* 137* 133* 127*  BUN 78* 77* 74* 52* 35*  CREATININE 6.53* 6.23* 5.97* 3.55* 2.62*  CALCIUM 9.5 9.0 8.1* 7.2* 7.4*  MG  --   --   --   --  1.4*    Liver Function Tests: Recent Labs  Lab 07/13/20 2254 07/16/20 0838  AST 12*  --   ALT 17  --   ALKPHOS 150*  --   BILITOT 0.9  --   PROT 8.4*  --   ALBUMIN 4.4 3.2*   No results for input(s): LIPASE, AMYLASE in the last 168 hours. No results for input(s): AMMONIA in the last 168 hours.  CBC: Recent Labs  Lab 07/13/20 2254 07/14/20 0136 07/14/20 0506 07/15/20 0511  WBC 13.8* 11.2* 10.3 8.1   NEUTROABS 10.2*  --   --   --   HGB 13.0 12.5 11.0* 11.6*  HCT 38.2 36.6 32.7* 35.4*  MCV 93.4 94.6 95.3 97.8  PLT 341 295 255 244    Cardiac Enzymes: No results for input(s): CKTOTAL, CKMB, CKMBINDEX, TROPONINI in the last 168 hours.  BNP: Invalid input(s): POCBNP  CBG: Recent Labs  Lab 07/14/20 1800 07/14/20 2354 07/15/20 1628 07/15/20 2037 07/16/20 0803  GLUCAP 159* 126* 118* 151* 126*    Microbiology: Results for orders placed or performed during the hospital encounter of 07/13/20  Resp Panel by RT-PCR (Flu A&B, Covid) Nasopharyngeal Swab     Status: None   Collection Time: 07/14/20 12:26 AM   Specimen: Nasopharyngeal Swab; Nasopharyngeal(NP) swabs in vial transport medium  Result Value Ref Range Status   SARS Coronavirus 2 by RT PCR NEGATIVE NEGATIVE Final    Comment: (NOTE) SARS-CoV-2 target nucleic acids are NOT DETECTED.  The SARS-CoV-2 RNA is generally detectable in upper respiratory specimens during the acute phase of infection. The lowest concentration of SARS-CoV-2 viral copies this assay can detect is 138 copies/mL. A negative result does not preclude SARS-Cov-2 infection and should not be used as the sole basis for treatment or  other patient management decisions. A negative result may occur with  improper specimen collection/handling, submission of specimen other than nasopharyngeal swab, presence of viral mutation(s) within the areas targeted by this assay, and inadequate number of viral copies(<138 copies/mL). A negative result must be combined with clinical observations, patient history, and epidemiological information. The expected result is Negative.  Fact Sheet for Patients:  BloggerCourse.com  Fact Sheet for Healthcare Providers:  SeriousBroker.it  This test is no t yet approved or cleared by the [place] FDA and  has been authorized for detection and/or diagnosis of SARS-CoV-2 by FDA  under an Emergency Use Authorization (EUA). This EUA will remain  in effect (meaning this test can be used) for the duration of the COVID-19 declaration under Section 564(b)(1) of the Act, 21 U.S.C.section 360bbb-3(b)(1), unless the authorization is terminated  or revoked sooner.       Influenza A by PCR NEGATIVE NEGATIVE Final   Influenza B by PCR NEGATIVE NEGATIVE Final    Comment: (NOTE) The Xpert Xpress SARS-CoV-2/FLU/RSV plus assay is intended as an aid in the diagnosis of influenza from Nasopharyngeal swab specimens and should not be used as a sole basis for treatment. Nasal washings and aspirates are unacceptable for Xpert Xpress SARS-CoV-2/FLU/RSV testing.  Fact Sheet for Patients: BloggerCourse.com  Fact Sheet for Healthcare Providers: SeriousBroker.it  This test is not yet approved or cleared by the [place] FDA and has been authorized for detection and/or diagnosis of SARS-CoV-2 by FDA under an Emergency Use Authorization (EUA). This EUA will remain in effect (meaning this test can be used) for the duration of the COVID-19 declaration under Section 564(b)(1) of the Act, 21 U.S.C. section 360bbb-3(b)(1), unless the authorization is terminated or revoked.  Performed at [facility], [address] [place], [place] [address]     Coagulation Studies: No results for input(s): LABPROT, INR in the last 72 hours.  Urinalysis: Recent Labs    07/14/20 0044  COLORURINE YELLOW*  LABSPEC 1.010  PHURINE 5.0  GLUCOSEU NEGATIVE  HGBUR SMALL*  BILIRUBINUR NEGATIVE  KETONESUR NEGATIVE  PROTEINUR 100*  NITRITE NEGATIVE  LEUKOCYTESUR NEGATIVE      Imaging: No results found.   Medications:   . sodium chloride 125 mL/hr at 07/15/20 2120   . heparin  5,000 Units Subcutaneous Q8H  . insulin aspart  0-9 Units Subcutaneous TID WC  . [START ON 07/17/2020] levothyroxine  100 mcg Oral QAC breakfast  .  LORazepam  1 mg Oral Q8H  . mirtazapine  15 mg Oral QHS  . potassium chloride  20 mEq Oral BID  . sertraline  100 mg Oral Daily   acetaminophen **OR** acetaminophen, ondansetron **OR** ondansetron (ZOFRAN) IV  Assessment/ Plan:  69 y.o. female with bipolar disorder admitted on 07/13/2020 with lithium toxicity and tremors.  1.  Acute kidney injury/metabolic acidosis.  Baseline creatinine 1.06 on 08/13/2018.  Presenting creatinine 6.5.  Renal ultrasound unremarkable with the exception of small left renal cyst.  Creatinine down to 2.6.  Continue IV fluid hydration but will switch to sodium bicarb and given declining serum bicarbonate.  2.  Lithium toxicity.  Lithium level down to 0.59.  LOS: 2 Jasmine Simmons 12/28/202111:14 AM

## 2020-07-16 NOTE — Progress Notes (Signed)
Initial Nutrition Assessment  DOCUMENTATION CODES:   Not applicable  INTERVENTION:   Ensure Enlive po BID, each supplement provides 350 kcal and 20 grams of protein  MVI daily   NUTRITION DIAGNOSIS:   Inadequate oral intake related to acute illness as evidenced by meal completion < 25%.  GOAL:   Patient will meet greater than or equal to 90% of their needs  MONITOR:   PO intake,Supplement acceptance,Labs,Weight trends,Skin,I & O's  REASON FOR ASSESSMENT:   Malnutrition Screening Tool    ASSESSMENT:   69 y/o female with medical history significant for hypothyroidism, chronic diarrhea and bipolar disorder on lithium with history of hospitalization in 2020 lithium toxicity who presents with mild lithium toxicity   Visited pt's room today. Pt sleeping at time of RD visit; RD did not wake patient. Pt's lunch tray was sitting on her side table with only bites eaten from it. Pt is documented to be eating only sips and bites of meals in hospital. RD will add supplements and MVI to help pt meet her estimated needs. There is no recent documented weight history to determine if any significant weight changes.   Medications reviewed and include: heparin, insulin, synthroid, remeron, KCl, Mg sulfate, Na bicarbonate   Labs reviewed: BUN 35(H), creat 2.62(H), Mg 1.4(L) cbgs- 126, 131 x 24hrs AIC 7.2(H)- 12/26  NUTRITION - FOCUSED PHYSICAL EXAM:  Flowsheet Row Most Recent Value  Orbital Region No depletion  Upper Arm Region No depletion  Thoracic and Lumbar Region No depletion  Buccal Region No depletion  Temple Region No depletion  Clavicle Bone Region No depletion  Clavicle and Acromion Bone Region No depletion  Scapular Bone Region No depletion  Dorsal Hand No depletion  Patellar Region No depletion  Anterior Thigh Region No depletion  Posterior Calf Region No depletion  Edema (RD Assessment) None  Hair Reviewed  Eyes Reviewed  Mouth Reviewed  Skin Reviewed  Nails  Reviewed     Diet Order:   Diet Order            Diet regular Room service appropriate? Yes; Fluid consistency: Thin  Diet effective now                EDUCATION NEEDS:   No education needs have been identified at this time  Skin:  Skin Assessment: Reviewed RN Assessment  Last BM:  12/28- type 7  Height:   Ht Readings from Last 1 Encounters:  07/13/20 5\' 4"  (1.626 m)    Weight:   Wt Readings from Last 1 Encounters:  07/16/20 74 kg    Ideal Body Weight:  54.5 kg  BMI:  Body mass index is 28.01 kg/m.  Estimated Nutritional Needs:   Kcal:  1600-1800kcal/day  Protein:  80-90g/day  Fluid:  1.4-1.6L/day  07/18/20 MS, RD, LDN Please refer to [facility] for RD and/or RD on-call/weekend/after hours pager

## 2020-07-17 LAB — BASIC METABOLIC PANEL
Anion gap: 10 (ref 5–15)
BUN: 22 mg/dL (ref 8–23)
CO2: 18 mmol/L — ABNORMAL LOW (ref 22–32)
Calcium: 7.4 mg/dL — ABNORMAL LOW (ref 8.9–10.3)
Chloride: 116 mmol/L — ABNORMAL HIGH (ref 98–111)
Creatinine, Ser: 2.12 mg/dL — ABNORMAL HIGH (ref 0.44–1.00)
GFR, Estimated: 25 mL/min — ABNORMAL LOW (ref >60)
Glucose, Bld: 114 mg/dL — ABNORMAL HIGH (ref 70–99)
Potassium: 3.6 mmol/L (ref 3.5–5.1)
Sodium: 144 mmol/L (ref 135–145)

## 2020-07-17 LAB — GLUCOSE, CAPILLARY
Glucose-Capillary: 128 mg/dL — ABNORMAL HIGH (ref 70–99)
Glucose-Capillary: 122 mg/dL — ABNORMAL HIGH (ref 70–99)
Glucose-Capillary: 171 mg/dL — ABNORMAL HIGH (ref 70–99)
Glucose-Capillary: 102 mg/dL — ABNORMAL HIGH (ref 70–99)

## 2020-07-17 LAB — MAGNESIUM: Magnesium: 2.6 mg/dL — ABNORMAL HIGH (ref 1.7–2.4)

## 2020-07-17 MED ORDER — SODIUM CHLORIDE 0.9% FLUSH
3.0000 mL | Freq: Two times a day (BID) | INTRAVENOUS | Status: DC
  Administered 2020-07-18 – 2020-07-19 (×2): 3 mL via INTRAVENOUS

## 2020-07-17 NOTE — Progress Notes (Signed)
[facility]  ROUNDING NOTE   Subjective:  Patient overall reports feeling better. Creatinine down to 2.1. Serum bicarbonate currently 18.   Objective:  Vital signs in last 24 hours:  Temp:  [97.6 F (36.4 C)-98.4 F (36.9 C)] 97.7 F (36.5 C) (12/29 1207) Pulse Rate:  [75-84] 80 (12/29 1207) Resp:  [17-19] 17 (12/29 1207) BP: (152-160)/(72-82) 160/78 (12/29 1207) SpO2:  [97 %-98 %] 97 % (12/29 1207) Weight:  [71.2 kg] 71.2 kg (12/29 0529)  Weight change: -2.812 kg Filed Weights   07/16/20 0243 07/16/20 0428 07/17/20 0529  Weight: 74 kg 74 kg 71.2 kg    Intake/Output: I/O last 3 completed shifts: In: 5978.8 [P.O.:240; I.V.:5738.8] Out: 1400 [Urine:1400]   Intake/Output this shift:  Total I/O In: 120 [P.O.:120] Out: 650 [Urine:650]  Physical Exam: General:  No acute distress  Head:  Normocephalic, atraumatic. Moist oral mucosal membranes  Eyes:  Anicteric  Neck:  Supple  Lungs:   Clear to auscultation, normal effort  Heart:  S1S2 no rubs  Abdomen:   Soft, nontender, bowel sounds present  Extremities:  No peripheral edema.  Neurologic:  Awake, alert, following commands  Skin:  No lesions  Access:  No dialysis access    Basic Metabolic Panel: Recent Labs  Lab 07/14/20 0123 07/14/20 0506 07/15/20 0511 07/16/20 0424 07/17/20 0424  NA 131* 133* 141 141 144  K 3.2* 3.2* 3.6 4.0 3.6  CL 100 106 116* 117* 116*  CO2 17* 16* 17* 14* 18*  GLUCOSE 128* 137* 133* 127* 114*  BUN 77* 74* 52* 35* 22  CREATININE 6.23* 5.97* 3.55* 2.62* 2.12*  CALCIUM 9.0 8.1* 7.2* 7.4* 7.4*  MG  --   --   --  1.4* 2.6*    Liver Function Tests: Recent Labs  Lab 07/13/20 2254 07/16/20 0838  AST 12*  --   ALT 17  --   ALKPHOS 150*  --   BILITOT 0.9  --   PROT 8.4*  --   ALBUMIN 4.4 3.2*   No results for input(s): LIPASE, AMYLASE in the last 168 hours. No results for input(s): AMMONIA in the last 168 hours.  CBC: Recent Labs  Lab 07/13/20 2254  07/14/20 0136 07/14/20 0506 07/15/20 0511  WBC 13.8* 11.2* 10.3 8.1  NEUTROABS 10.2*  --   --   --   HGB 13.0 12.5 11.0* 11.6*  HCT 38.2 36.6 32.7* 35.4*  MCV 93.4 94.6 95.3 97.8  PLT 341 295 255 244    Cardiac Enzymes: No results for input(s): CKTOTAL, CKMB, CKMBINDEX, TROPONINI in the last 168 hours.  BNP: Invalid input(s): POCBNP  CBG: Recent Labs  Lab 07/16/20 1150 07/16/20 1637 07/16/20 1941 07/17/20 0814 07/17/20 1207  GLUCAP 131* 165* 129* 128* 122*    Microbiology: Results for orders placed or performed during the hospital encounter of 07/13/20  Resp Panel by RT-PCR (Flu A&B, Covid) Nasopharyngeal Swab     Status: None   Collection Time: 07/14/20 12:26 AM   Specimen: Nasopharyngeal Swab; Nasopharyngeal(NP) swabs in vial transport medium  Result Value Ref Range Status   SARS Coronavirus 2 by RT PCR NEGATIVE NEGATIVE Final    Comment: (NOTE) SARS-CoV-2 target nucleic acids are NOT DETECTED.  The SARS-CoV-2 RNA is generally detectable in upper respiratory specimens during the acute phase of infection. The lowest concentration of SARS-CoV-2 viral copies this assay can detect is 138 copies/mL. A negative result does not preclude SARS-Cov-2 infection and should not be used as the sole basis  for treatment or other patient management decisions. A negative result may occur with  improper specimen collection/handling, submission of specimen other than nasopharyngeal swab, presence of viral mutation(s) within the areas targeted by this assay, and inadequate number of viral copies(<138 copies/mL). A negative result must be combined with clinical observations, patient history, and epidemiological information. The expected result is Negative.  Fact Sheet for Patients:  BloggerCourse.com  Fact Sheet for Healthcare Providers:  SeriousBroker.it  This test is no t yet approved or cleared by the [place] FDA and   has been authorized for detection and/or diagnosis of SARS-CoV-2 by FDA under an Emergency Use Authorization (EUA). This EUA will remain  in effect (meaning this test can be used) for the duration of the COVID-19 declaration under Section 564(b)(1) of the Act, 21 U.S.C.section 360bbb-3(b)(1), unless the authorization is terminated  or revoked sooner.       Influenza A by PCR NEGATIVE NEGATIVE Final   Influenza B by PCR NEGATIVE NEGATIVE Final    Comment: (NOTE) The Xpert Xpress SARS-CoV-2/FLU/RSV plus assay is intended as an aid in the diagnosis of influenza from Nasopharyngeal swab specimens and should not be used as a sole basis for treatment. Nasal washings and aspirates are unacceptable for Xpert Xpress SARS-CoV-2/FLU/RSV testing.  Fact Sheet for Patients: BloggerCourse.com  Fact Sheet for Healthcare Providers: SeriousBroker.it  This test is not yet approved or cleared by the [place] FDA and has been authorized for detection and/or diagnosis of SARS-CoV-2 by FDA under an Emergency Use Authorization (EUA). This EUA will remain in effect (meaning this test can be used) for the duration of the COVID-19 declaration under Section 564(b)(1) of the Act, 21 U.S.C. section 360bbb-3(b)(1), unless the authorization is terminated or revoked.  Performed at [facility], [address] [place], [place] [address]     Coagulation Studies: No results for input(s): LABPROT, INR in the last 72 hours.  Urinalysis: No results for input(s): COLORURINE, LABSPEC, PHURINE, GLUCOSEU, HGBUR, BILIRUBINUR, KETONESUR, PROTEINUR, UROBILINOGEN, NITRITE, LEUKOCYTESUR in the last 72 hours.  Invalid input(s): APPERANCEUR    Imaging: No results found.   Medications:   .  sodium bicarbonate (isotonic) infusion in sterile water 100 mL/hr at 07/17/20 0524   . feeding supplement  237 mL Oral BID BM  . heparin  5,000 Units  Subcutaneous Q8H  . insulin aspart  0-9 Units Subcutaneous TID WC  . levothyroxine  100 mcg Oral QAC breakfast  . LORazepam  1 mg Oral Q8H  . mirtazapine  15 mg Oral QHS  . multivitamin with minerals  1 tablet Oral Daily  . sertraline  100 mg Oral Daily   acetaminophen **OR** acetaminophen, ondansetron **OR** ondansetron (ZOFRAN) IV  Assessment/ Plan:  69 y.o. female with bipolar disorder admitted on 07/13/2020 with lithium toxicity and tremors.  1.  Acute kidney injury/metabolic acidosis.  Baseline creatinine 1.06 on 08/13/2018.  Presenting creatinine 6.5.  Renal ultrasound unremarkable with the exception of small left renal cyst.  Renal function continues to improve.  Creatinine down to 2.1.  Serum bicarbonate up to 18.  Maintain patient on sodium bicarbonate drip.  Follow renal parameters and avoid nephrotoxins.  2.  Lithium toxicity.  Lithium level down to 0.59.   LOS: 3 Albina Gosney 12/29/20212:26 PM

## 2020-07-17 NOTE — Progress Notes (Signed)
PROGRESS NOTE    Jasmine Simmons   GOT:[id number]  DOB: Apr 22, 1951  PCP: Patient, No Pcp Per    DOA: 07/13/2020 LOS: 3   Brief Narrative   Jasmine Simmons is a 69 y.o. female with medical history significant for hypothyroidism, chronic diarrhea and bipolar disorder on lithium with history of hospitalization in 2020 lithium toxicity who presented to the ER with a complaint of slowed thinking/brain fog and tremors for about a week, similar to when she had lithium toxicity a year ago.  She had been taking her usual dose of lithium, however had been having nausea and vomiting in addition to her baseline chronic diarrhea.  Evaluation in the ED showed acute renal failure with elevated lithium level 1.73, creatinine 6.53 (baseline 1.06-year ago), associated anion gap metabolic acidosis, leukocytosis 13k.  EKG was normal sinus rhythm with mild QTC prolongation of 464.  Poison control was contacted and recommended IV hydration and serial lithium levels.  Patient was admitted to hospitalist service for management of lithium toxicity likely due to dehydration and associated acute kidney injury.     Assessment & Plan   Principal Problem:   Lithium toxicity Active Problems:   AKI (acute kidney injury) (HCC)   High anion gap metabolic acidosis   Bipolar disorder, unspecified (HCC)   Hypothyroidism   Lithium toxicity  - due to severe AKI and poor clearance.  Pt compliant with prescribed dose. --12/26 Per poison control, IV fluids at 250 cc/h and trend lithium levels every 6 hour --12/27 lithium level has normalized --psych consult, recs appreciated --cont to hold lithium  Acute kidney injury  - Baseline creatinine 1.06 on 08/13/2018.  Presenting creatinine 6.5. --likely hypovolemic given N/V on top of chronic diarrhea.   Renal U/S negative for hydronephrosis to suggest obstructive uropathy. Cr trending down with IV fluids. --nephrology consulted Plan: --cont MIVF with sodium bicarb@100   ml/hr, per nephrology  Hypomagnesemia  - monitor and replete PRN with IV mag  Nausea/vomiting - POA, improving.  PRN antiemetics.    Hyponatremia - mild Na 130 on admission, likely due to N/V/D and hypovolemia.   Resolved with IV fluids.   --Monitor BMP  Chronic diarrhea - at baseline per pt.  Contributing factor to hypovolemia and AKI along with concurrent N/V.   Anion gap metabolic acidosis - resolved.  POA due to renal failure. --cont MIVF with sodium bicarb@100  ml/hr, per nephrology  Bipolar disorder - with lithium toxicity on admission which was due to acute renal failure.  Psych consulted. 12/27 - pt reports hallucinations last night, she states typical for her without lithium --psych to recommend an alternative tx for Bipolar  Hypothyroidism  - TSH within normal 1.650. --cont home Synthroid     Patient BMI: Body mass index is 26.95 kg/m.   DVT prophylaxis: heparin injection 5,000 Units Start: 07/14/20 0600   Diet:  Diet Orders (From admission, onward)    Start     Ordered   07/14/20 0134  Diet regular Room service appropriate? Yes; Fluid consistency: Thin  Diet effective now       Question Answer Comment  Room service appropriate? Yes   Fluid consistency: Thin      07/14/20 0137            Code Status: Full Code    Subjective 07/17/20    Pt reported feeling much better today, less shaky and can talk normally now.  Mood is stable.     Disposition Plan & Communication   Status is:  Inpatient  Inpatient status remains appropriate due to severity of illness with acute renal failure requiring IV fluids.  Dispo: The patient is from: home              Anticipated d/c is to: home              Anticipated d/c date is: 2 days              Medically stable for d/c: No.     Family Communication:    Consults, Procedures, Significant Events   Consultants:   Psychiatry  Procedures:   None  Antimicrobials:  Anti-infectives (From admission, onward)    None       Objective   Vitals:   07/17/20 0813 07/17/20 1207 07/17/20 1617 07/17/20 1750  BP: (!) 156/72 (!) 160/78 139/69 130/61  Pulse: 75 80 87 (!) 102  Resp: 18 17 16 18   Temp: 97.6 F (36.4 C) 97.7 F (36.5 C) 98.4 F (36.9 C) 98.2 F (36.8 C)  TempSrc: Oral Oral Oral Oral  SpO2: 97% 97% 95% 96%  Weight:      Height:        Intake/Output Summary (Last 24 hours) at 07/17/2020 1802 Last data filed at 07/17/2020 1339 Gross per 24 hour  Intake 1156.67 ml  Output 950 ml  Net 206.67 ml   Filed Weights   07/16/20 0243 07/16/20 0428 07/17/20 0529  Weight: 74 kg 74 kg 71.2 kg    Physical Exam:  Constitutional: NAD, AAOx3 HEENT: conjunctivae and lids normal, EOMI CV: No cyanosis.   RESP: normal respiratory effort, on RA Extremities: No effusions, edema in BLE SKIN: warm, dry and intact Neuro: II - XII grossly intact.   Psych: Normal mood and affect.  Appropriate judgement and reason   Labs   Data Reviewed: I have personally reviewed following labs and imaging studies  CBC: Recent Labs  Lab 07/13/20 2254 07/14/20 0136 07/14/20 0506 07/15/20 0511  WBC 13.8* 11.2* 10.3 8.1  NEUTROABS 10.2*  --   --   --   HGB 13.0 12.5 11.0* 11.6*  HCT 38.2 36.6 32.7* 35.4*  MCV 93.4 94.6 95.3 97.8  PLT 341 295 255 244   Basic Metabolic Panel: Recent Labs  Lab 07/14/20 0123 07/14/20 0506 07/15/20 0511 07/16/20 0424 07/17/20 0424  NA 131* 133* 141 141 144  K 3.2* 3.2* 3.6 4.0 3.6  CL 100 106 116* 117* 116*  CO2 17* 16* 17* 14* 18*  GLUCOSE 128* 137* 133* 127* 114*  BUN 77* 74* 52* 35* 22  CREATININE 6.23* 5.97* 3.55* 2.62* 2.12*  CALCIUM 9.0 8.1* 7.2* 7.4* 7.4*  MG  --   --   --  1.4* 2.6*   GFR: Estimated Creatinine Clearance: 24.6 mL/min (A) (by C-G formula based on SCr of 2.12 mg/dL (H)). Liver Function Tests: Recent Labs  Lab 07/13/20 2254 07/16/20 0838  AST 12*  --   ALT 17  --   ALKPHOS 150*  --   BILITOT 0.9  --   PROT 8.4*  --   ALBUMIN  4.4 3.2*   No results for input(s): LIPASE, AMYLASE in the last 168 hours. No results for input(s): AMMONIA in the last 168 hours. Coagulation Profile: No results for input(s): INR, PROTIME in the last 168 hours. Cardiac Enzymes: No results for input(s): CKTOTAL, CKMB, CKMBINDEX, TROPONINI in the last 168 hours. BNP (last 3 results) No results for input(s): PROBNP in the last 8760 hours. HbA1C: No results for  input(s): HGBA1C in the last 72 hours. CBG: Recent Labs  Lab 07/16/20 1637 07/16/20 1941 07/17/20 0814 07/17/20 1207 07/17/20 1727  GLUCAP 165* 129* 128* 122* 171*   Lipid Profile: No results for input(s): CHOL, HDL, LDLCALC, TRIG, CHOLHDL, LDLDIRECT in the last 72 hours. Thyroid Function Tests: No results for input(s): TSH, T4TOTAL, FREET4, T3FREE, THYROIDAB in the last 72 hours. Anemia Panel: No results for input(s): VITAMINB12, FOLATE, FERRITIN, TIBC, IRON, RETICCTPCT in the last 72 hours. Sepsis Labs: No results for input(s): PROCALCITON, LATICACIDVEN in the last 168 hours.  Recent Results (from the past 240 hour(s))  Resp Panel by RT-PCR (Flu A&B, Covid) Nasopharyngeal Swab     Status: None   Collection Time: 07/14/20 12:26 AM   Specimen: Nasopharyngeal Swab; Nasopharyngeal(NP) swabs in vial transport medium  Result Value Ref Range Status   SARS Coronavirus 2 by RT PCR NEGATIVE NEGATIVE Final    Comment: (NOTE) SARS-CoV-2 target nucleic acids are NOT DETECTED.  The SARS-CoV-2 RNA is generally detectable in upper respiratory specimens during the acute phase of infection. The lowest concentration of SARS-CoV-2 viral copies this assay can detect is 138 copies/mL. A negative result does not preclude SARS-Cov-2 infection and should not be used as the sole basis for treatment or other patient management decisions. A negative result may occur with  improper specimen collection/handling, submission of specimen other than nasopharyngeal swab, presence of viral  mutation(s) within the areas targeted by this assay, and inadequate number of viral copies(<138 copies/mL). A negative result must be combined with clinical observations, patient history, and epidemiological information. The expected result is Negative.  Fact Sheet for Patients:  BloggerCourse.com  Fact Sheet for Healthcare Providers:  SeriousBroker.it  This test is no t yet approved or cleared by the [place] FDA and  has been authorized for detection and/or diagnosis of SARS-CoV-2 by FDA under an Emergency Use Authorization (EUA). This EUA will remain  in effect (meaning this test can be used) for the duration of the COVID-19 declaration under Section 564(b)(1) of the Act, 21 U.S.C.section 360bbb-3(b)(1), unless the authorization is terminated  or revoked sooner.       Influenza A by PCR NEGATIVE NEGATIVE Final   Influenza B by PCR NEGATIVE NEGATIVE Final    Comment: (NOTE) The Xpert Xpress SARS-CoV-2/FLU/RSV plus assay is intended as an aid in the diagnosis of influenza from Nasopharyngeal swab specimens and should not be used as a sole basis for treatment. Nasal washings and aspirates are unacceptable for Xpert Xpress SARS-CoV-2/FLU/RSV testing.  Fact Sheet for Patients: BloggerCourse.com  Fact Sheet for Healthcare Providers: SeriousBroker.it  This test is not yet approved or cleared by the [place] FDA and has been authorized for detection and/or diagnosis of SARS-CoV-2 by FDA under an Emergency Use Authorization (EUA). This EUA will remain in effect (meaning this test can be used) for the duration of the COVID-19 declaration under Section 564(b)(1) of the Act, 21 U.S.C. section 360bbb-3(b)(1), unless the authorization is terminated or revoked.  Performed at [facility], [address] [place], [place] [address]       Imaging Studies   No  results found.   Medications   Scheduled Meds: . feeding supplement  237 mL Oral BID BM  . heparin  5,000 Units Subcutaneous Q8H  . insulin aspart  0-9 Units Subcutaneous TID WC  . levothyroxine  100 mcg Oral QAC breakfast  . LORazepam  1 mg Oral Q8H  . mirtazapine  15 mg Oral QHS  .  multivitamin with minerals  1 tablet Oral Daily  . sertraline  100 mg Oral Daily   Continuous Infusions: .  sodium bicarbonate (isotonic) infusion in sterile water 100 mL/hr at 07/17/20 1753       LOS: 3 days     Darlin Priestlyina Mitcheal Sweetin, md [facility]  07/17/2020, 6:02 PM

## 2020-07-18 LAB — CBC
HCT: 30.6 % — ABNORMAL LOW (ref 36.0–46.0)
Hemoglobin: 10.1 g/dL — ABNORMAL LOW (ref 12.0–15.0)
MCH: 32 pg (ref 26.0–34.0)
MCHC: 33 g/dL (ref 30.0–36.0)
MCV: 96.8 fL (ref 80.0–100.0)
Platelets: 229 10*3/uL (ref 150–400)
RBC: 3.16 MIL/uL — ABNORMAL LOW (ref 3.87–5.11)
RDW: 13.2 % (ref 11.5–15.5)
WBC: 5.9 10*3/uL (ref 4.0–10.5)
nRBC: 0 % (ref 0.0–0.2)

## 2020-07-18 LAB — BASIC METABOLIC PANEL
Anion gap: 11 (ref 5–15)
BUN: 15 mg/dL (ref 8–23)
CO2: 29 mmol/L (ref 22–32)
Calcium: 7.5 mg/dL — ABNORMAL LOW (ref 8.9–10.3)
Chloride: 106 mmol/L (ref 98–111)
Creatinine, Ser: 2.03 mg/dL — ABNORMAL HIGH (ref 0.44–1.00)
GFR, Estimated: 26 mL/min — ABNORMAL LOW (ref >60)
Glucose, Bld: 130 mg/dL — ABNORMAL HIGH (ref 70–99)
Potassium: 2.9 mmol/L — ABNORMAL LOW (ref 3.5–5.1)
Sodium: 146 mmol/L — ABNORMAL HIGH (ref 135–145)

## 2020-07-18 LAB — GLUCOSE, CAPILLARY
Glucose-Capillary: 121 mg/dL — ABNORMAL HIGH (ref 70–99)
Glucose-Capillary: 179 mg/dL — ABNORMAL HIGH (ref 70–99)
Glucose-Capillary: 109 mg/dL — ABNORMAL HIGH (ref 70–99)

## 2020-07-18 LAB — MAGNESIUM: Magnesium: 1.6 mg/dL — ABNORMAL LOW (ref 1.7–2.4)

## 2020-07-18 MED ORDER — MAGNESIUM SULFATE 2 GM/50ML IV SOLN
2.0000 g | Freq: Once | INTRAVENOUS | Status: AC
  Administered 2020-07-18: 13:00:00 2 g via INTRAVENOUS
  Filled 2020-07-18: qty 50

## 2020-07-18 MED ORDER — POTASSIUM CHLORIDE CRYS ER 20 MEQ PO TBCR
40.0000 meq | EXTENDED_RELEASE_TABLET | ORAL | Status: AC
  Administered 2020-07-18 (×2): 40 meq via ORAL
  Filled 2020-07-18 (×2): qty 2

## 2020-07-18 NOTE — Progress Notes (Signed)
[facility]  ROUNDING NOTE   Subjective:  Patient seen and evaluated at bedside. Creatinine down slightly to 2.0. Serum sodium a bit high at 146.   Objective:  Vital signs in last 24 hours:  Temp:  [97.8 F (36.6 C)-98.4 F (36.9 C)] 98 F (36.7 C) (12/30 1146) Pulse Rate:  [85-102] 86 (12/30 1146) Resp:  [16-18] 17 (12/30 0325) BP: (130-179)/(61-91) 142/76 (12/30 1146) SpO2:  [92 %-96 %] 92 % (12/30 1146) Weight:  [72.6 kg] 72.6 kg (12/30 0325)  Weight change: 1.406 kg Filed Weights   07/16/20 0428 07/17/20 0529 07/18/20 0325  Weight: 74 kg 71.2 kg 72.6 kg    Intake/Output: I/O last 3 completed shifts: In: 3586.3 [P.O.:120; I.V.:3466.3] Out: 2750 [Urine:2750]   Intake/Output this shift:  Total I/O In: 1218.5 [P.O.:360; I.V.:800; IV Piggyback:58.5] Out: 500 [Urine:500]  Physical Exam: General:  No acute distress  Head:  Normocephalic, atraumatic. Moist oral mucosal membranes  Eyes:  Anicteric  Neck:  Supple  Lungs:   Clear to auscultation, normal effort  Heart:  S1S2 no rubs  Abdomen:   Soft, nontender, bowel sounds present  Extremities:  No peripheral edema.  Neurologic:  Awake, alert, following commands  Skin:  No lesions  Access:  No dialysis access    Basic Metabolic Panel: Recent Labs  Lab 07/14/20 0506 07/15/20 0511 07/16/20 0424 07/17/20 0424 07/18/20 0617  NA 133* 141 141 144 146*  K 3.2* 3.6 4.0 3.6 2.9*  CL 106 116* 117* 116* 106  CO2 16* 17* 14* 18* 29  GLUCOSE 137* 133* 127* 114* 130*  BUN 74* 52* 35* 22 15  CREATININE 5.97* 3.55* 2.62* 2.12* 2.03*  CALCIUM 8.1* 7.2* 7.4* 7.4* 7.5*  MG  --   --  1.4* 2.6* 1.6*    Liver Function Tests: Recent Labs  Lab 07/13/20 2254 07/16/20 0838  AST 12*  --   ALT 17  --   ALKPHOS 150*  --   BILITOT 0.9  --   PROT 8.4*  --   ALBUMIN 4.4 3.2*   No results for input(s): LIPASE, AMYLASE in the last 168 hours. No results for input(s): AMMONIA in the last 168  hours.  CBC: Recent Labs  Lab 07/13/20 2254 07/14/20 0136 07/14/20 0506 07/15/20 0511 07/18/20 0617  WBC 13.8* 11.2* 10.3 8.1 5.9  NEUTROABS 10.2*  --   --   --   --   HGB 13.0 12.5 11.0* 11.6* 10.1*  HCT 38.2 36.6 32.7* 35.4* 30.6*  MCV 93.4 94.6 95.3 97.8 96.8  PLT 341 295 255 244 229    Cardiac Enzymes: No results for input(s): CKTOTAL, CKMB, CKMBINDEX, TROPONINI in the last 168 hours.  BNP: Invalid input(s): POCBNP  CBG: Recent Labs  Lab 07/17/20 1207 07/17/20 1727 07/17/20 2221 07/18/20 0803 07/18/20 1147  GLUCAP 122* 171* 102* 121* 179*    Microbiology: Results for orders placed or performed during the hospital encounter of 07/13/20  Resp Panel by RT-PCR (Flu A&B, Covid) Nasopharyngeal Swab     Status: None   Collection Time: 07/14/20 12:26 AM   Specimen: Nasopharyngeal Swab; Nasopharyngeal(NP) swabs in vial transport medium  Result Value Ref Range Status   SARS Coronavirus 2 by RT PCR NEGATIVE NEGATIVE Final    Comment: (NOTE) SARS-CoV-2 target nucleic acids are NOT DETECTED.  The SARS-CoV-2 RNA is generally detectable in upper respiratory specimens during the acute phase of infection. The lowest concentration of SARS-CoV-2 viral copies this assay can detect is 138 copies/mL. A  negative result does not preclude SARS-Cov-2 infection and should not be used as the sole basis for treatment or other patient management decisions. A negative result may occur with  improper specimen collection/handling, submission of specimen other than nasopharyngeal swab, presence of viral mutation(s) within the areas targeted by this assay, and inadequate number of viral copies(<138 copies/mL). A negative result must be combined with clinical observations, patient history, and epidemiological information. The expected result is Negative.  Fact Sheet for Patients:  BloggerCourse.com  Fact Sheet for Healthcare Providers:   SeriousBroker.it  This test is no t yet approved or cleared by the [place] FDA and  has been authorized for detection and/or diagnosis of SARS-CoV-2 by FDA under an Emergency Use Authorization (EUA). This EUA will remain  in effect (meaning this test can be used) for the duration of the COVID-19 declaration under Section 564(b)(1) of the Act, 21 U.S.C.section 360bbb-3(b)(1), unless the authorization is terminated  or revoked sooner.       Influenza A by PCR NEGATIVE NEGATIVE Final   Influenza B by PCR NEGATIVE NEGATIVE Final    Comment: (NOTE) The Xpert Xpress SARS-CoV-2/FLU/RSV plus assay is intended as an aid in the diagnosis of influenza from Nasopharyngeal swab specimens and should not be used as a sole basis for treatment. Nasal washings and aspirates are unacceptable for Xpert Xpress SARS-CoV-2/FLU/RSV testing.  Fact Sheet for Patients: BloggerCourse.com  Fact Sheet for Healthcare Providers: SeriousBroker.it  This test is not yet approved or cleared by the [place] FDA and has been authorized for detection and/or diagnosis of SARS-CoV-2 by FDA under an Emergency Use Authorization (EUA). This EUA will remain in effect (meaning this test can be used) for the duration of the COVID-19 declaration under Section 564(b)(1) of the Act, 21 U.S.C. section 360bbb-3(b)(1), unless the authorization is terminated or revoked.  Performed at [facility], [address] [place], [place] [address]     Coagulation Studies: No results for input(s): LABPROT, INR in the last 72 hours.  Urinalysis: No results for input(s): COLORURINE, LABSPEC, PHURINE, GLUCOSEU, HGBUR, BILIRUBINUR, KETONESUR, PROTEINUR, UROBILINOGEN, NITRITE, LEUKOCYTESUR in the last 72 hours.  Invalid input(s): APPERANCEUR    Imaging: No results found.   Medications:   .  sodium bicarbonate (isotonic) infusion in  sterile water 100 mL/hr at 07/18/20 1400   . feeding supplement  237 mL Oral BID BM  . heparin  5,000 Units Subcutaneous Q8H  . insulin aspart  0-9 Units Subcutaneous TID WC  . levothyroxine  100 mcg Oral QAC breakfast  . LORazepam  1 mg Oral Q8H  . mirtazapine  15 mg Oral QHS  . multivitamin with minerals  1 tablet Oral Daily  . potassium chloride  40 mEq Oral Q4H  . sertraline  100 mg Oral Daily  . sodium chloride flush  3 mL Intravenous Q12H   acetaminophen **OR** acetaminophen, ondansetron **OR** ondansetron (ZOFRAN) IV  Assessment/ Plan:  69 y.o. female with bipolar disorder admitted on 07/13/2020 with lithium toxicity and tremors.  1.  Acute kidney injury/metabolic acidosis.  Baseline creatinine 1.06 on 08/13/2018.  Presenting creatinine 6.5.  Renal ultrasound unremarkable with the exception of small left renal cyst.  -BUN down to 50 with a creatinine of 2.0.  Serum bicarbonate up to 29.  Stop sodium bicarbonate infusion.  2.  Lithium toxicity.  Resolved with hydration.  Most recent lithium level was down to 0.59.  3.  Hypokalemia.  Serum potassium down to 2.9.  Agree with potassium for  repletion.   LOS: 4 Jasmine Simmons 12/30/20214:05 PM

## 2020-07-18 NOTE — Progress Notes (Signed)
PROGRESS NOTE    Jasmine Simmons   WUJ:[id number]  DOB: 1950-07-30  PCP: Patient, No Pcp Per    DOA: 07/13/2020 LOS: 4   Brief Narrative   Jasmine Simmons is a 69 y.o. female with medical history significant for hypothyroidism, chronic diarrhea and bipolar disorder on lithium with history of hospitalization in 2020 lithium toxicity who presented to the ER with a complaint of slowed thinking/brain fog and tremors for about a week, similar to when she had lithium toxicity a year ago.  She had been taking her usual dose of lithium, however had been having nausea and vomiting in addition to her baseline chronic diarrhea.  Evaluation in the ED showed acute renal failure with elevated lithium level 1.73, creatinine 6.53 (baseline 1.06-year ago), associated anion gap metabolic acidosis, leukocytosis 13k.  EKG was normal sinus rhythm with mild QTC prolongation of 464.  Poison control was contacted and recommended IV hydration and serial lithium levels.  Patient was admitted to hospitalist service for management of lithium toxicity likely due to dehydration and associated acute kidney injury.     Assessment & Plan   Principal Problem:   Lithium toxicity Active Problems:   AKI (acute kidney injury) (HCC)   High anion gap metabolic acidosis   Bipolar disorder, unspecified (HCC)   Hypothyroidism   Lithium toxicity  - due to severe AKI and poor clearance.  Pt compliant with prescribed dose. --12/26 Per poison control, IV fluids at 250 cc/h and trend lithium levels every 6 hour --12/27 lithium level has normalized --psych consult, recs appreciated --will not discharge on Lithium  Acute kidney injury  - Baseline creatinine 1.06 on 08/13/2018.  Presenting creatinine 6.5. --likely hypovolemic given N/V on top of chronic diarrhea.   Renal U/S negative for hydronephrosis.  Cr trending down with IV fluids. --nephrology consulted Plan: --cont sodium bicarb@100  ml/hr, per  nephrology  Hypomagnesemia  - monitor and replete PRN with IV mag  Nausea/vomiting - POA, improving.  PRN antiemetics.    Hyponatremia - mild Na 130 on admission, likely due to N/V/D and hypovolemia.   Resolved with IV fluids.   --Monitor BMP  Chronic diarrhea - at baseline per pt.  Contributing factor to hypovolemia and AKI along with concurrent N/V.   Anion gap metabolic acidosis - resolved.  POA due to renal failure. --cont sodium bicarb@100  ml/hr, per nephrololgy  Bipolar disorder - with lithium toxicity on admission which was due to acute renal failure.  Psych consulted. 12/27 - pt reports hallucinations last night, she states typical for her without lithium --f/u psych rec --will not discharge on Lithium  Hypothyroidism  - TSH within normal 1.650. --cont home Synthroid     Patient BMI: Body mass index is 27.48 kg/m.   DVT prophylaxis: heparin injection 5,000 Units Start: 07/14/20 0600   Diet:  Diet Orders (From admission, onward)    Start     Ordered   07/14/20 0134  Diet regular Room service appropriate? Yes; Fluid consistency: Thin  Diet effective now       Question Answer Comment  Room service appropriate? Yes   Fluid consistency: Thin      07/14/20 0137            Code Status: Full Code    Subjective 07/18/20    Pt reported doing good, mood stable.  Good urine output.  Eating ok.   Disposition Plan & Communication   Status is: Inpatient  Inpatient status remains appropriate due to severity of illness with  acute renal failure requiring IV fluids.  Dispo: The patient is from: home              Anticipated d/c is to: home              Anticipated d/c date is: 1-2 days              Medically not ready for d/c: still on IVF, per nephrology    Family Communication:    Consults, Procedures, Significant Events   Consultants:   Psychiatry  Procedures:   None  Antimicrobials:  Anti-infectives (From admission, onward)   None        Objective   Vitals:   07/18/20 0325 07/18/20 0801 07/18/20 1146 07/18/20 1653  BP: (!) 179/91 (!) 156/80 (!) 142/76 (!) 149/83  Pulse: 92 92 86 74  Resp: 17     Temp: 97.9 F (36.6 C) 97.8 F (36.6 C) 98 F (36.7 C) 97.6 F (36.4 C)  TempSrc: Oral Oral Oral Oral  SpO2: 94% 94% 92% 95%  Weight: 72.6 kg     Height:        Intake/Output Summary (Last 24 hours) at 07/18/2020 1712 Last data filed at 07/18/2020 1656 Gross per 24 hour  Intake 3648.13 ml  Output 3500 ml  Net 148.13 ml   Filed Weights   07/16/20 0428 07/17/20 0529 07/18/20 0325  Weight: 74 kg 71.2 kg 72.6 kg    Physical Exam:  Constitutional: NAD, AAOx3 HEENT: conjunctivae and lids normal, EOMI CV: No cyanosis.   RESP: normal respiratory effort, on RA Extremities: No effusions, edema in BLE SKIN: warm, dry and intact Neuro: II - XII grossly intact.   Psych: Normal mood and affect.  Appropriate judgement and reason   Labs   Data Reviewed: I have personally reviewed following labs and imaging studies  CBC: Recent Labs  Lab 07/13/20 2254 07/14/20 0136 07/14/20 0506 07/15/20 0511 07/18/20 0617  WBC 13.8* 11.2* 10.3 8.1 5.9  NEUTROABS 10.2*  --   --   --   --   HGB 13.0 12.5 11.0* 11.6* 10.1*  HCT 38.2 36.6 32.7* 35.4* 30.6*  MCV 93.4 94.6 95.3 97.8 96.8  PLT 341 295 255 244 229   Basic Metabolic Panel: Recent Labs  Lab 07/14/20 0506 07/15/20 0511 07/16/20 0424 07/17/20 0424 07/18/20 0617  NA 133* 141 141 144 146*  K 3.2* 3.6 4.0 3.6 2.9*  CL 106 116* 117* 116* 106  CO2 16* 17* 14* 18* 29  GLUCOSE 137* 133* 127* 114* 130*  BUN 74* 52* 35* 22 15  CREATININE 5.97* 3.55* 2.62* 2.12* 2.03*  CALCIUM 8.1* 7.2* 7.4* 7.4* 7.5*  MG  --   --  1.4* 2.6* 1.6*   GFR: Estimated Creatinine Clearance: 25.9 mL/min (A) (by C-G formula based on SCr of 2.03 mg/dL (H)). Liver Function Tests: Recent Labs  Lab 07/13/20 2254 07/16/20 0838  AST 12*  --   ALT 17  --   ALKPHOS 150*  --   BILITOT 0.9   --   PROT 8.4*  --   ALBUMIN 4.4 3.2*   No results for input(s): LIPASE, AMYLASE in the last 168 hours. No results for input(s): AMMONIA in the last 168 hours. Coagulation Profile: No results for input(s): INR, PROTIME in the last 168 hours. Cardiac Enzymes: No results for input(s): CKTOTAL, CKMB, CKMBINDEX, TROPONINI in the last 168 hours. BNP (last 3 results) No results for input(s): PROBNP in the last 8760 hours. HbA1C: No  results for input(s): HGBA1C in the last 72 hours. CBG: Recent Labs  Lab 07/17/20 1727 07/17/20 2221 07/18/20 0803 07/18/20 1147 07/18/20 1654  GLUCAP 171* 102* 121* 179* 109*   Lipid Profile: No results for input(s): CHOL, HDL, LDLCALC, TRIG, CHOLHDL, LDLDIRECT in the last 72 hours. Thyroid Function Tests: No results for input(s): TSH, T4TOTAL, FREET4, T3FREE, THYROIDAB in the last 72 hours. Anemia Panel: No results for input(s): VITAMINB12, FOLATE, FERRITIN, TIBC, IRON, RETICCTPCT in the last 72 hours. Sepsis Labs: No results for input(s): PROCALCITON, LATICACIDVEN in the last 168 hours.  Recent Results (from the past 240 hour(s))  Resp Panel by RT-PCR (Flu A&B, Covid) Nasopharyngeal Swab     Status: None   Collection Time: 07/14/20 12:26 AM   Specimen: Nasopharyngeal Swab; Nasopharyngeal(NP) swabs in vial transport medium  Result Value Ref Range Status   SARS Coronavirus 2 by RT PCR NEGATIVE NEGATIVE Final    Comment: (NOTE) SARS-CoV-2 target nucleic acids are NOT DETECTED.  The SARS-CoV-2 RNA is generally detectable in upper respiratory specimens during the acute phase of infection. The lowest concentration of SARS-CoV-2 viral copies this assay can detect is 138 copies/mL. A negative result does not preclude SARS-Cov-2 infection and should not be used as the sole basis for treatment or other patient management decisions. A negative result may occur with  improper specimen collection/handling, submission of specimen other than nasopharyngeal  swab, presence of viral mutation(s) within the areas targeted by this assay, and inadequate number of viral copies(<138 copies/mL). A negative result must be combined with clinical observations, patient history, and epidemiological information. The expected result is Negative.  Fact Sheet for Patients:  BloggerCourse.com  Fact Sheet for Healthcare Providers:  SeriousBroker.it  This test is no t yet approved or cleared by the [place] FDA and  has been authorized for detection and/or diagnosis of SARS-CoV-2 by FDA under an Emergency Use Authorization (EUA). This EUA will remain  in effect (meaning this test can be used) for the duration of the COVID-19 declaration under Section 564(b)(1) of the Act, 21 U.S.C.section 360bbb-3(b)(1), unless the authorization is terminated  or revoked sooner.       Influenza A by PCR NEGATIVE NEGATIVE Final   Influenza B by PCR NEGATIVE NEGATIVE Final    Comment: (NOTE) The Xpert Xpress SARS-CoV-2/FLU/RSV plus assay is intended as an aid in the diagnosis of influenza from Nasopharyngeal swab specimens and should not be used as a sole basis for treatment. Nasal washings and aspirates are unacceptable for Xpert Xpress SARS-CoV-2/FLU/RSV testing.  Fact Sheet for Patients: BloggerCourse.com  Fact Sheet for Healthcare Providers: SeriousBroker.it  This test is not yet approved or cleared by the [place] FDA and has been authorized for detection and/or diagnosis of SARS-CoV-2 by FDA under an Emergency Use Authorization (EUA). This EUA will remain in effect (meaning this test can be used) for the duration of the COVID-19 declaration under Section 564(b)(1) of the Act, 21 U.S.C. section 360bbb-3(b)(1), unless the authorization is terminated or revoked.  Performed at [facility], [address], [place], [place] [address]        Imaging Studies   No results found.   Medications   Scheduled Meds: . feeding supplement  237 mL Oral BID BM  . heparin  5,000 Units Subcutaneous Q8H  . insulin aspart  0-9 Units Subcutaneous TID WC  . levothyroxine  100 mcg Oral QAC breakfast  . LORazepam  1 mg Oral Q8H  . mirtazapine  15 mg Oral  QHS  . multivitamin with minerals  1 tablet Oral Daily  . sertraline  100 mg Oral Daily  . sodium chloride flush  3 mL Intravenous Q12H   Continuous Infusions:      LOS: 4 days     Darlin Priestly, md [facility]  07/18/2020, 5:12 PM

## 2020-07-18 NOTE — Consult Note (Signed)
[facility]   Reason for Consult: Follow-up consult patient with elevated lithium level and history of possible bipolar Referring Physician:  Fran LowesLai Patient Identification: Jasmine Simmons MRN:  [id number] Principal Diagnosis: Lithium toxicity Diagnosis:  Principal Problem:   Lithium toxicity Active Problems:   AKI (acute kidney injury) (HCC)   High anion gap metabolic acidosis   Bipolar disorder, unspecified (HCC)   Hypothyroidism   Total Time spent with patient: 30 minutes  Subjective:   Jasmine Simmons is a 69 y.o. female patient admitted with "I am better".  HPI: Patient seen for follow-up.  She was awake in her room.  Still lying down in bed looking very tired.  Conversed with me but in low tones and only a few words.  Nevertheless she said she was feeling better.  Denied being depressed denied being manic.  Said she had been able to consume a little bit of food.  Appeared to be alert and oriented and able to understand and make decisions.  No sign of mania.  Past Psychiatric History: Past history of anxiety and depression and a diagnosis of possible bipolar disorder  Risk to Self:   Risk to Others:   Prior Inpatient Therapy:   Prior Outpatient Therapy:    Past Medical History:  Past Medical History:  Diagnosis Date  . Bipolar 1 disorder [facility](HCC)     Past Surgical History:  Procedure Laterality Date  . CESAREAN SECTION     Family History:  Family History  Family history unknown: Yes   Family Psychiatric  History: See previous Social History:  Social History   Substance and Sexual Activity  Alcohol Use Never     Social History   Substance and Sexual Activity  Drug Use Never    Social History   Socioeconomic History  . Marital status: Single    Spouse name: Not on file  . Number of children: Not on file  . Years of education: Not on file  . Highest education level: Not on file  Occupational History  . Not on file  Tobacco Use  .  Smoking status: Never Smoker  . Smokeless tobacco: Never Used  Vaping Use  . Vaping Use: Never used  Substance and Sexual Activity  . Alcohol use: Never  . Drug use: Never  . Sexual activity: Not on file  Other Topics Concern  . Not on file  Social History Narrative  . Not on file   Social Determinants of Health   Financial Resource Strain: Not on file  Food Insecurity: Not on file  Transportation Needs: Not on file  Physical Activity: Not on file  Stress: Not on file  Social Connections: Not on file   Additional Social History:    Allergies:  No Known Allergies  Labs:  Results for orders placed or performed during the hospital encounter of 07/13/20 (from the past 48 hour(s))  Glucose, capillary     Status: Abnormal   Collection Time: 07/16/20  4:37 PM  Result Value Ref Range   Glucose-Capillary 165 (H) 70 - 99 mg/dL    Comment: Glucose reference range applies only to samples taken after fasting for at least 8 hours.  Glucose, capillary     Status: Abnormal   Collection Time: 07/16/20  7:41 PM  Result Value Ref Range   Glucose-Capillary 129 (H) 70 - 99 mg/dL    Comment: Glucose reference range applies only to samples taken after fasting for at least 8 hours.  Magnesium  Status: Abnormal   Collection Time: 07/17/20  4:24 AM  Result Value Ref Range   Magnesium 2.6 (H) 1.7 - 2.4 mg/dL    Comment: Performed at [facility], [address] [place], [place] [address]  Basic metabolic panel     Status: Abnormal   Collection Time: 07/17/20  4:24 AM  Result Value Ref Range   Sodium 144 135 - 145 mmol/L   Potassium 3.6 3.5 - 5.1 mmol/L   Chloride 116 (H) 98 - 111 mmol/L   CO2 18 (L) 22 - 32 mmol/L   Glucose, Bld 114 (H) 70 - 99 mg/dL    Comment: Glucose reference range applies only to samples taken after fasting for at least 8 hours.   BUN 22 8 - 23 mg/dL   Creatinine, Ser 2.13 (H) 0.44 - 1.00 mg/dL   Calcium 7.4 (L) 8.9 - 10.3 mg/dL   GFR, Estimated 25 (L)  >60 mL/min    Comment: (NOTE) Calculated using the CKD-EPI Creatinine Equation (2021)    Anion gap 10 5 - 15    Comment: Performed at [facility], [address] [place], [place] [address]  Glucose, capillary     Status: Abnormal   Collection Time: 07/17/20  8:14 AM  Result Value Ref Range   Glucose-Capillary 128 (H) 70 - 99 mg/dL    Comment: Glucose reference range applies only to samples taken after fasting for at least 8 hours.  Glucose, capillary     Status: Abnormal   Collection Time: 07/17/20 12:07 PM  Result Value Ref Range   Glucose-Capillary 122 (H) 70 - 99 mg/dL    Comment: Glucose reference range applies only to samples taken after fasting for at least 8 hours.  Glucose, capillary     Status: Abnormal   Collection Time: 07/17/20  5:27 PM  Result Value Ref Range   Glucose-Capillary 171 (H) 70 - 99 mg/dL    Comment: Glucose reference range applies only to samples taken after fasting for at least 8 hours.  Glucose, capillary     Status: Abnormal   Collection Time: 07/17/20 10:21 PM  Result Value Ref Range   Glucose-Capillary 102 (H) 70 - 99 mg/dL    Comment: Glucose reference range applies only to samples taken after fasting for at least 8 hours.   Comment 1 Notify RN   Basic metabolic panel     Status: Abnormal   Collection Time: 07/18/20  6:17 AM  Result Value Ref Range   Sodium 146 (H) 135 - 145 mmol/L   Potassium 2.9 (L) 3.5 - 5.1 mmol/L   Chloride 106 98 - 111 mmol/L   CO2 29 22 - 32 mmol/L   Glucose, Bld 130 (H) 70 - 99 mg/dL    Comment: Glucose reference range applies only to samples taken after fasting for at least 8 hours.   BUN 15 8 - 23 mg/dL   Creatinine, Ser 8.46 (H) 0.44 - 1.00 mg/dL   Calcium 7.5 (L) 8.9 - 10.3 mg/dL   GFR, Estimated 26 (L) >60 mL/min    Comment: (NOTE) Calculated using the CKD-EPI Creatinine Equation (2021)    Anion gap 11 5 - 15    Comment: Performed at [facility], [address] [place], [place] [address]   CBC     Status: Abnormal   Collection Time: 07/18/20  6:17 AM  Result Value Ref Range   WBC 5.9 4.0 - 10.5 K/uL   RBC 3.16 (L) 3.87 - 5.11  MIL/uL   Hemoglobin 10.1 (L) 12.0 - 15.0 g/dL   HCT 42.5 (L) 95.6 - 38.7 %   MCV 96.8 80.0 - 100.0 fL   MCH 32.0 26.0 - 34.0 pg   MCHC 33.0 30.0 - 36.0 g/dL   RDW 56.4 33.2 - 95.1 %   Platelets 229 150 - 400 K/uL   nRBC 0.0 0.0 - 0.2 %    Comment: Performed at [facility], [address], [place], [place] [address]  Magnesium     Status: Abnormal   Collection Time: 07/18/20  6:17 AM  Result Value Ref Range   Magnesium 1.6 (L) 1.7 - 2.4 mg/dL    Comment: Performed at [facility], [address] [place], [place] [address]  Glucose, capillary     Status: Abnormal   Collection Time: 07/18/20  8:03 AM  Result Value Ref Range   Glucose-Capillary 121 (H) 70 - 99 mg/dL    Comment: Glucose reference range applies only to samples taken after fasting for at least 8 hours.   Comment 1 Notify RN    Comment 2 Document in Chart   Glucose, capillary     Status: Abnormal   Collection Time: 07/18/20 11:47 AM  Result Value Ref Range   Glucose-Capillary 179 (H) 70 - 99 mg/dL    Comment: Glucose reference range applies only to samples taken after fasting for at least 8 hours.   Comment 1 Notify RN    Comment 2 Document in Chart     Current Facility-Administered Medications  Medication Dose Route Frequency Provider Last Rate Last Admin  . acetaminophen (TYLENOL) tablet 650 mg  650 mg Oral Q6H PRN Andris Baumann, MD   650 mg at 07/16/20 2153   Or  . acetaminophen (TYLENOL) suppository 650 mg  650 mg Rectal Q6H PRN Andris Baumann, MD      . feeding supplement (ENSURE ENLIVE / ENSURE PLUS) liquid 237 mL  237 mL Oral BID BM Esaw Grandchild A, DO   237 mL at 07/18/20 1357  . heparin injection 5,000 Units  5,000 Units Subcutaneous Q8H Andris Baumann, MD   5,000 Units at 07/18/20 1357  . insulin aspart (novoLOG) injection 0-9 Units  0-9  Units Subcutaneous TID WC Esaw Grandchild A, DO   2 Units at 07/18/20 1243  . levothyroxine (SYNTHROID) tablet 100 mcg  100 mcg Oral QAC breakfast Esaw Grandchild A, DO   100 mcg at 07/18/20 1601  . LORazepam (ATIVAN) tablet 1 mg  1 mg Oral Q8H Griffith, Kelly A, DO   1 mg at 07/18/20 1357  . mirtazapine (REMERON) tablet 15 mg  15 mg Oral QHS Esaw Grandchild A, DO   15 mg at 07/17/20 2154  . multivitamin with minerals tablet 1 tablet  1 tablet Oral Daily Esaw Grandchild A, DO   1 tablet at 07/18/20 0848  . ondansetron (ZOFRAN) tablet 4 mg  4 mg Oral Q6H PRN Andris Baumann, MD       Or  . ondansetron [facility]) injection 4 mg  4 mg Intravenous Q6H PRN Andris Baumann, MD   4 mg at 07/14/20 1626  . potassium chloride SA (KLOR-CON) CR tablet 40 mEq  40 mEq Oral Q4H Darlin Priestly, MD   40 mEq at 07/18/20 1240  . sertraline (ZOLOFT) tablet 100 mg  100 mg Oral Daily Esaw Grandchild A, DO   100 mg at 07/18/20 0848  . sodium chloride flush (NS) 0.9 % injection 3 mL  3 mL Intravenous Q12H Darlin Priestly, MD   3 mL at 07/18/20 8841    Musculoskeletal: Strength & Muscle Tone: within normal limits Gait & Station: normal Patient leans: N/A  Psychiatric Specialty Exam: Physical Exam Vitals and nursing note reviewed.  Constitutional:      Appearance: She is well-developed and well-nourished.  HENT:     Head: Normocephalic and atraumatic.  Eyes:     Conjunctiva/sclera: Conjunctivae normal.     Pupils: Pupils are equal, round, and reactive to light.  Cardiovascular:     Heart sounds: Normal heart sounds.  Pulmonary:     Effort: Pulmonary effort is normal.  Abdominal:     Palpations: Abdomen is soft.  Musculoskeletal:        General: Normal range of motion.     Cervical back: Normal range of motion.  Skin:    General: Skin is warm and dry.  Neurological:     General: No focal deficit present.     Mental Status: She is alert.  Psychiatric:        Mood and Affect: Mood normal.     Review of Systems   Constitutional: Negative.   HENT: Negative.   Eyes: Negative.   Respiratory: Negative.   Cardiovascular: Negative.   Gastrointestinal: Negative.   Musculoskeletal: Negative.   Skin: Negative.   Neurological: Negative.   Psychiatric/Behavioral: Negative.     Blood pressure (!) 142/76, pulse 86, temperature 98 F (36.7 C), temperature source Oral, resp. rate 17, height 5\' 4"  (1.626 m), weight 72.6 kg, SpO2 92 %.Body mass index is 27.48 kg/m.  General Appearance: Disheveled  Eye Contact:  Fair  Speech:  Slow  Volume:  Decreased  Mood:  Euthymic  Affect:  Constricted  Thought Process:  Coherent  Orientation:  Full (Time, Place, and Person)  Thought Content:  Logical  Suicidal Thoughts:  No  Homicidal Thoughts:  No  Memory:  Immediate;   Fair Recent;   Fair Remote;   Fair  Judgement:  Fair  Insight:  Fair  Psychomotor Activity:  Normal  Concentration:  Concentration: Fair  Recall:  of Knowledge:  Fair  Language:  Fair  Akathisia:  No  Handed:  Right  AIMS (if indicated):     Assets:  Desire for Improvement Housing Resilience Social Support  ADL's:  Impaired  Cognition:  WNL  Sleep:        Treatment Plan Summary: Plan Patient without complaint but continues to seem a little rundown.  Kidney function still gradually improving.  I talked with her about the pros and cons of starting any new medication.  We both agreed that restarting the lithium would not be a good idea at this time.  Given her lack of acute symptoms and the history she is providing I think at this point I would recommend not restarting any medicine but that she see an outpatient provider when she does get discharged from the hospital.  I will continue to follow-up while she is here to make sure that this is still the right decision.  Disposition: No evidence of imminent risk to self or others at present.   Patient does not meet criteria for psychiatric inpatient admission. Supportive therapy  provided about ongoing stressors.  [employer], MD 07/18/2020 4:30 PM

## 2020-07-19 LAB — GLUCOSE, CAPILLARY
Glucose-Capillary: 128 mg/dL — ABNORMAL HIGH (ref 70–99)
Glucose-Capillary: 161 mg/dL — ABNORMAL HIGH (ref 70–99)

## 2020-07-19 LAB — CBC
HCT: 33.2 % — ABNORMAL LOW (ref 36.0–46.0)
Hemoglobin: 10.7 g/dL — ABNORMAL LOW (ref 12.0–15.0)
MCH: 31.7 pg (ref 26.0–34.0)
MCHC: 32.2 g/dL (ref 30.0–36.0)
MCV: 98.2 fL (ref 80.0–100.0)
Platelets: 224 10*3/uL (ref 150–400)
RBC: 3.38 MIL/uL — ABNORMAL LOW (ref 3.87–5.11)
RDW: 13.3 % (ref 11.5–15.5)
WBC: 7.2 10*3/uL (ref 4.0–10.5)
nRBC: 0 % (ref 0.0–0.2)

## 2020-07-19 LAB — MAGNESIUM: Magnesium: 2.1 mg/dL (ref 1.7–2.4)

## 2020-07-19 LAB — BASIC METABOLIC PANEL
Anion gap: 9 (ref 5–15)
BUN: 14 mg/dL (ref 8–23)
CO2: 30 mmol/L (ref 22–32)
Calcium: 7.7 mg/dL — ABNORMAL LOW (ref 8.9–10.3)
Chloride: 105 mmol/L (ref 98–111)
Creatinine, Ser: 2.11 mg/dL — ABNORMAL HIGH (ref 0.44–1.00)
GFR, Estimated: 25 mL/min — ABNORMAL LOW (ref >60)
Glucose, Bld: 129 mg/dL — ABNORMAL HIGH (ref 70–99)
Potassium: 3.2 mmol/L — ABNORMAL LOW (ref 3.5–5.1)
Sodium: 144 mmol/L (ref 135–145)

## 2020-07-19 MED ORDER — LOPERAMIDE HCL 2 MG PO CAPS
2.0000 mg | ORAL_CAPSULE | ORAL | Status: DC | PRN
  Administered 2020-07-19: 2 mg via ORAL
  Filled 2020-07-19: qty 1

## 2020-07-19 MED ORDER — POTASSIUM CHLORIDE CRYS ER 20 MEQ PO TBCR
40.0000 meq | EXTENDED_RELEASE_TABLET | Freq: Once | ORAL | Status: AC
  Administered 2020-07-19: 40 meq via ORAL
  Filled 2020-07-19: qty 2

## 2020-07-19 NOTE — Discharge Summary (Signed)
Physician Discharge Summary   Jasmine Simmons  female DOB: 31-Jul-1950  FAO:[id number]  PCP: Patient, No Pcp Per  Admit date: 07/13/2020 Discharge date: 07/19/2020  Admitted From: home Disposition:  home CODE STATUS: Full code   Hospital Course:  For full details, please see H&P, progress notes, consult notes and ancillary notes.  Briefly,  Jasmine Simmons a 69 y.o.femalewith medical history significant forhypothyroidism, chronic diarrheaand bipolar disorder on lithium with history of hospitalization in 2020lithium toxicity who presented to the ER with a complaint of slowed thinking/brain fogand tremors for about a week, similar to when she had lithium toxicity a year ago. She had been taking her usual dose of lithium, however had been having nausea and vomiting in addition to her baseline chronic diarrhea.  Lithium toxicity  Bipolar disorder  due to severe AKI and poor clearance.  Pt compliant with prescribed dose.  12/26 Per poison control, IV fluids at 250 cc/h and trend lithium levels every 6 hour.  12/27 lithium level has normalized.  Psych was consulted and recommended not resuming Lithium on discharge, and following up with pt's own outpatient psychiatrist for alternative regimen to Lithium.  Acute kidney injury  Baseline creatinine 1.06 on 08/13/2018. Presenting creatinine 6.5.  Likely hypovolemic given N/V on top of chronic diarrhea, though lithium toxicity may also contributed.  Renal U/S negative for hydronephrosis.  Cr trending down with IV fluids.  nephrology consulted.  Pt received MIVF during her entire hospitalization.  Cr stabilized around 2.1 in the last 3 days prior to discharge.  Hypomagnesemia monitored and repleted PRN with IV mag  Nausea/vomiting - POA, improving.   PRN antiemetics.    Hyponatremia - mild Na 130 on admission, likely due to N/V/D and hypovolemia.   Resolved with IV fluids.    Chronic diarrhea  at baseline per pt.    Anion  gap metabolic acidosis -POA, resolved.   Due to renal failure.  Pt received MIVF with sodium bicarb.  Hypothyroidism  TSH within normal 1.650.  Continued home Synthroid   Discharge Diagnoses:  Principal Problem:   Lithium toxicity Active Problems:   AKI (acute kidney injury) (HCC)   High anion gap metabolic acidosis   Bipolar disorder, unspecified (HCC)   Hypothyroidism    Discharge Instructions:  Allergies as of 07/19/2020   No Known Allergies     Medication List    STOP taking these medications   lithium carbonate 300 MG capsule     TAKE these medications   HYDROCORTISONE ACE (RECTAL) 30 MG Supp Place 1 suppository rectally 2 (two) times daily as needed.   levothyroxine 100 MCG tablet Commonly known as: SYNTHROID Take 100 mcg by mouth daily before breakfast.   LORazepam 1 MG tablet Commonly known as: ATIVAN Take 1 mg by mouth every 8 (eight) hours.   mirtazapine 15 MG tablet Commonly known as: REMERON Take 15 mg by mouth at bedtime.   sertraline 100 MG tablet Commonly known as: ZOLOFT Take 100 mg by mouth daily.   sitaGLIPtin-metformin 50-1000 MG tablet Commonly known as: JANUMET Take 1 tablet by mouth 2 (two) times daily with a meal.         No Known Allergies   The results of significant diagnostics from this hospitalization (including imaging, microbiology, ancillary and laboratory) are listed below for reference.   Consultations:   Procedures/Studies: US RENAL  Result Date: 07/14/2020 CLINICAL DATA:  Acute renal failure EXAM: RENAL / URINARY TRACT ULTRASOUND COMPLETE COMPARISON:  None FINDINGS: Right Kidney: Renal  measurements: 10.7 x 5.2 x 5.3 cm = volume: 157 mL. Normal morphology without mass or hydronephrosis. Left Kidney: Renal measurements: 11.4 x 5.4 x 4.2 cm = volume: 137 mL. Normal cortical thickness and echogenicity. Small cyst at mid to inferior pole 16 x 15 x 14 mm. No additional mass, hydronephrosis or shadowing  calcification. Bladder: Appears normal for degree of bladder distention. Other: Incidentally noted increased echogenicity of the liver, question fatty infiltration of this can be seen with cirrhosis and some infiltrative disorders. IMPRESSION: Small LEFT renal cyst. Otherwise negative renal ultrasound. Suspected fatty infiltration of liver as above. Electronically Signed   By: Ulyses Southward M.D.   On: 07/14/2020 10:44      Labs: BNP (last 3 results) No results for input(s): BNP in the last 8760 hours. Basic Metabolic Panel: Recent Labs  Lab 07/15/20 0511 07/16/20 0424 07/17/20 0424 07/18/20 0617 07/19/20 0552  NA 141 141 144 146* 144  K 3.6 4.0 3.6 2.9* 3.2*  CL 116* 117* 116* 106 105  CO2 17* 14* 18* 29 30  GLUCOSE 133* 127* 114* 130* 129*  BUN 52* 35* 22 15 14   CREATININE 3.55* 2.62* 2.12* 2.03* 2.11*  CALCIUM 7.2* 7.4* 7.4* 7.5* 7.7*  MG  --  1.4* 2.6* 1.6* 2.1   Liver Function Tests: Recent Labs  Lab 07/13/20 2254 07/16/20 0838  AST 12*  --   ALT 17  --   ALKPHOS 150*  --   BILITOT 0.9  --   PROT 8.4*  --   ALBUMIN 4.4 3.2*   No results for input(s): LIPASE, AMYLASE in the last 168 hours. No results for input(s): AMMONIA in the last 168 hours. CBC: Recent Labs  Lab 07/13/20 2254 07/14/20 0136 07/14/20 0506 07/15/20 0511 07/18/20 0617 07/19/20 0552  WBC 13.8* 11.2* 10.3 8.1 5.9 7.2  NEUTROABS 10.2*  --   --   --   --   --   HGB 13.0 12.5 11.0* 11.6* 10.1* 10.7*  HCT 38.2 36.6 32.7* 35.4* 30.6* 33.2*  MCV 93.4 94.6 95.3 97.8 96.8 98.2  PLT 341 295 255 244 229 224   Cardiac Enzymes: No results for input(s): CKTOTAL, CKMB, CKMBINDEX, TROPONINI in the last 168 hours. BNP: Invalid input(s): POCBNP CBG: Recent Labs  Lab 07/18/20 0803 07/18/20 1147 07/18/20 1654 07/19/20 0725 07/19/20 1156  GLUCAP 121* 179* 109* 128* 161*   D-Dimer No results for input(s): DDIMER in the last 72 hours. Hgb A1c No results for input(s): HGBA1C in the last 72  hours. Lipid Profile No results for input(s): CHOL, HDL, LDLCALC, TRIG, CHOLHDL, LDLDIRECT in the last 72 hours. Thyroid function studies No results for input(s): TSH, T4TOTAL, T3FREE, THYROIDAB in the last 72 hours.  Invalid input(s): FREET3 Anemia work up No results for input(s): VITAMINB12, FOLATE, FERRITIN, TIBC, IRON, RETICCTPCT in the last 72 hours. Urinalysis    Component Value Date/Time   COLORURINE YELLOW (A) 07/14/2020 0044   APPEARANCEUR HAZY (A) 07/14/2020 0044   LABSPEC 1.010 07/14/2020 0044   PHURINE 5.0 07/14/2020 0044   GLUCOSEU NEGATIVE 07/14/2020 0044   HGBUR SMALL (A) 07/14/2020 0044   BILIRUBINUR NEGATIVE 07/14/2020 0044   KETONESUR NEGATIVE 07/14/2020 0044   PROTEINUR 100 (A) 07/14/2020 0044   NITRITE NEGATIVE 07/14/2020 0044   LEUKOCYTESUR NEGATIVE 07/14/2020 0044   Sepsis Labs Invalid input(s): PROCALCITONIN,  WBC,  LACTICIDVEN Microbiology Recent Results (from the past 240 hour(s))  Resp Panel by RT-PCR (Flu A&B, Covid) Nasopharyngeal Swab     Status: None  Collection Time: 07/14/20 12:26 AM   Specimen: Nasopharyngeal Swab; Nasopharyngeal(NP) swabs in vial transport medium  Result Value Ref Range Status   SARS Coronavirus 2 by RT PCR NEGATIVE NEGATIVE Final    Comment: (NOTE) SARS-CoV-2 target nucleic acids are NOT DETECTED.  The SARS-CoV-2 RNA is generally detectable in upper respiratory specimens during the acute phase of infection. The lowest concentration of SARS-CoV-2 viral copies this assay can detect is 138 copies/mL. A negative result does not preclude SARS-Cov-2 infection and should not be used as the sole basis for treatment or other patient management decisions. A negative result may occur with  improper specimen collection/handling, submission of specimen other than nasopharyngeal swab, presence of viral mutation(s) within the areas targeted by this assay, and inadequate number of viral copies(<138 copies/mL). A negative result must be  combined with clinical observations, patient history, and epidemiological information. The expected result is Negative.  Fact Sheet for Patients:  BloggerCourse.com  Fact Sheet for Healthcare Providers:  SeriousBroker.it  This test is no t yet approved or cleared by the [place] FDA and  has been authorized for detection and/or diagnosis of SARS-CoV-2 by FDA under an Emergency Use Authorization (EUA). This EUA will remain  in effect (meaning this test can be used) for the duration of the COVID-19 declaration under Section 564(b)(1) of the Act, 21 U.S.C.section 360bbb-3(b)(1), unless the authorization is terminated  or revoked sooner.       Influenza A by PCR NEGATIVE NEGATIVE Final   Influenza B by PCR NEGATIVE NEGATIVE Final    Comment: (NOTE) The Xpert Xpress SARS-CoV-2/FLU/RSV plus assay is intended as an aid in the diagnosis of influenza from Nasopharyngeal swab specimens and should not be used as a sole basis for treatment. Nasal washings and aspirates are unacceptable for Xpert Xpress SARS-CoV-2/FLU/RSV testing.  Fact Sheet for Patients: BloggerCourse.com  Fact Sheet for Healthcare Providers: SeriousBroker.it  This test is not yet approved or cleared by the [place] FDA and has been authorized for detection and/or diagnosis of SARS-CoV-2 by FDA under an Emergency Use Authorization (EUA). This EUA will remain in effect (meaning this test can be used) for the duration of the COVID-19 declaration under Section 564(b)(1) of the Act, 21 U.S.C. section 360bbb-3(b)(1), unless the authorization is terminated or revoked.  Performed at [facility], [address] [place], [place] [address]      Total time spend on discharging this patient, including the last patient exam, discussing the hospital stay, instructions for ongoing care as it relates to all  pertinent caregivers, as well as preparing the medical discharge records, prescriptions, and/or referrals as applicable, is 45 minutes.    Darlin Priestly, MD  [facility] 07/19/2020, 2:35 PM

## 2020-07-19 NOTE — Progress Notes (Signed)
[facility]  ROUNDING NOTE   Subjective:  Creatinine seems to have settled in the low twos. Awake and alert. Resting comfortably in bed.   Objective:  Vital signs in last 24 hours:  Temp:  [97.6 F (36.4 C)-99 F (37.2 C)] 98.2 F (36.8 C) (12/31 1504) Pulse Rate:  [74-101] 86 (12/31 1504) Resp:  [18-20] 18 (12/31 1504) BP: (118-154)/(59-87) 136/65 (12/31 1504) SpO2:  [93 %-96 %] 96 % (12/31 1504)  Weight change:  Filed Weights   07/16/20 0428 07/17/20 0529 07/18/20 0325  Weight: 74 kg 71.2 kg 72.6 kg    Intake/Output: I/O last 3 completed shifts: In: 4008.1 [P.O.:720; I.V.:3229.6; IV Piggyback:58.5] Out: 3500 [Urine:3500]   Intake/Output this shift:  Total I/O In: 120 [P.O.:120] Out: -   Physical Exam: General:  No acute distress  Head:  Normocephalic, atraumatic. Moist oral mucosal membranes  Eyes:  Anicteric  Neck:  Supple  Lungs:   Clear to auscultation, normal effort  Heart:  S1S2 no rubs  Abdomen:   Soft, nontender, bowel sounds present  Extremities:  No peripheral edema.  Neurologic:  Awake, alert, following commands  Skin:  No lesions  Access:  No dialysis access    Basic Metabolic Panel: Recent Labs  Lab 07/15/20 0511 07/16/20 0424 07/17/20 0424 07/18/20 0617 07/19/20 0552  NA 141 141 144 146* 144  K 3.6 4.0 3.6 2.9* 3.2*  CL 116* 117* 116* 106 105  CO2 17* 14* 18* 29 30  GLUCOSE 133* 127* 114* 130* 129*  BUN 52* 35* $Remov'22 15 14  'NlXdFF$ CREATININE 3.55* 2.62* 2.12* 2.03* 2.11*  CALCIUM 7.2* 7.4* 7.4* 7.5* 7.7*  MG  --  1.4* 2.6* 1.6* 2.1    Liver Function Tests: Recent Labs  Lab 07/13/20 2254 07/16/20 0838  AST 12*  --   ALT 17  --   ALKPHOS 150*  --   BILITOT 0.9  --   PROT 8.4*  --   ALBUMIN 4.4 3.2*   No results for input(s): LIPASE, AMYLASE in the last 168 hours. No results for input(s): AMMONIA in the last 168 hours.  CBC: Recent Labs  Lab 07/13/20 2254 07/14/20 0136 07/14/20 0506 07/15/20 0511 07/18/20 0617  07/19/20 0552  WBC 13.8* 11.2* 10.3 8.1 5.9 7.2  NEUTROABS 10.2*  --   --   --   --   --   HGB 13.0 12.5 11.0* 11.6* 10.1* 10.7*  HCT 38.2 36.6 32.7* 35.4* 30.6* 33.2*  MCV 93.4 94.6 95.3 97.8 96.8 98.2  PLT 341 295 255 244 229 224    Cardiac Enzymes: No results for input(s): CKTOTAL, CKMB, CKMBINDEX, TROPONINI in the last 168 hours.  BNP: Invalid input(s): POCBNP  CBG: Recent Labs  Lab 07/18/20 0803 07/18/20 1147 07/18/20 1654 07/19/20 0725 07/19/20 1156  GLUCAP 121* 179* 109* 128* 161*    Microbiology: Results for orders placed or performed during the hospital encounter of 07/13/20  Resp Panel by RT-PCR (Flu A&B, Covid) Nasopharyngeal Swab     Status: None   Collection Time: 07/14/20 12:26 AM   Specimen: Nasopharyngeal Swab; Nasopharyngeal(NP) swabs in vial transport medium  Result Value Ref Range Status   SARS Coronavirus 2 by RT PCR NEGATIVE NEGATIVE Final    Comment: (NOTE) SARS-CoV-2 target nucleic acids are NOT DETECTED.  The SARS-CoV-2 RNA is generally detectable in upper respiratory specimens during the acute phase of infection. The lowest concentration of SARS-CoV-2 viral copies this assay can detect is 138 copies/mL. A negative result does not  preclude SARS-Cov-2 infection and should not be used as the sole basis for treatment or other patient management decisions. A negative result may occur with  improper specimen collection/handling, submission of specimen other than nasopharyngeal swab, presence of viral mutation(s) within the areas targeted by this assay, and inadequate number of viral copies(<138 copies/mL). A negative result must be combined with clinical observations, patient history, and epidemiological information. The expected result is Negative.  Fact Sheet for Patients:  EntrepreneurPulse.com.au  Fact Sheet for Healthcare Providers:  IncredibleEmployment.be  This test is no t yet approved or cleared by  the [place] FDA and  has been authorized for detection and/or diagnosis of SARS-CoV-2 by FDA under an Emergency Use Authorization (EUA). This EUA will remain  in effect (meaning this test can be used) for the duration of the COVID-19 declaration under Section 564(b)(1) of the Act, 21 U.S.C.section 360bbb-3(b)(1), unless the authorization is terminated  or revoked sooner.       Influenza A by PCR NEGATIVE NEGATIVE Final   Influenza B by PCR NEGATIVE NEGATIVE Final    Comment: (NOTE) The Xpert Xpress SARS-CoV-2/FLU/RSV plus assay is intended as an aid in the diagnosis of influenza from Nasopharyngeal swab specimens and should not be used as a sole basis for treatment. Nasal washings and aspirates are unacceptable for Xpert Xpress SARS-CoV-2/FLU/RSV testing.  Fact Sheet for Patients: EntrepreneurPulse.com.au  Fact Sheet for Healthcare Providers: IncredibleEmployment.be  This test is not yet approved or cleared by the [place] FDA and has been authorized for detection and/or diagnosis of SARS-CoV-2 by FDA under an Emergency Use Authorization (EUA). This EUA will remain in effect (meaning this test can be used) for the duration of the COVID-19 declaration under Section 564(b)(1) of the Act, 21 U.S.C. section 360bbb-3(b)(1), unless the authorization is terminated or revoked.  Performed at [facility], [address] [place], [place] [address]     Coagulation Studies: No results for input(s): LABPROT, INR in the last 72 hours.  Urinalysis: No results for input(s): COLORURINE, LABSPEC, PHURINE, GLUCOSEU, HGBUR, BILIRUBINUR, KETONESUR, PROTEINUR, UROBILINOGEN, NITRITE, LEUKOCYTESUR in the last 72 hours.  Invalid input(s): APPERANCEUR    Imaging: No results found.   Medications:    . feeding supplement  237 mL Oral BID BM  . heparin  5,000 Units Subcutaneous Q8H  . insulin aspart  0-9 Units Subcutaneous TID WC  .  levothyroxine  100 mcg Oral QAC breakfast  . LORazepam  1 mg Oral Q8H  . mirtazapine  15 mg Oral QHS  . multivitamin with minerals  1 tablet Oral Daily  . sertraline  100 mg Oral Daily  . sodium chloride flush  3 mL Intravenous Q12H   acetaminophen **OR** acetaminophen, loperamide, ondansetron **OR** ondansetron (ZOFRAN) IV  Assessment/ Plan:  70 y.o. female with bipolar disorder admitted on 07/13/2020 with lithium toxicity and tremors.  1.  Acute kidney injury/metabolic acidosis.  Baseline creatinine 1.06 on 08/13/2018.  Presenting creatinine 6.5.  Renal ultrasound unremarkable with the exception of small left renal cyst.  -Creatinine seems to have settled at 2.1.  Unclear as to whether this may be her new baseline.  Even in January 2020 her EGFR was 55.  Continue to monitor renal parameters.  2.  Lithium toxicity.  Resolved with hydration.  Most recent lithium level was down to 0.59.  3.  Hypokalemia.  Potassium up to 3.2.  Continue to monitor serum potassium periodically.   LOS: 5 Riccardo Holeman 12/31/20213:13 PM

## 2020-08-07 ENCOUNTER — Encounter: Payer: Self-pay | Admitting: Gastroenterology

## 2020-08-07 ENCOUNTER — Ambulatory Visit (INDEPENDENT_AMBULATORY_CARE_PROVIDER_SITE_OTHER): Payer: Self-pay | Admitting: Gastroenterology

## 2020-08-07 ENCOUNTER — Other Ambulatory Visit: Payer: Self-pay

## 2020-08-07 VITALS — BP 163/98 | HR 111 | Ht 64.0 in | Wt 162.4 lb

## 2020-08-07 DIAGNOSIS — K529 Noninfective gastroenteritis and colitis, unspecified: Secondary | ICD-10-CM

## 2020-08-07 DIAGNOSIS — K649 Unspecified hemorrhoids: Secondary | ICD-10-CM

## 2020-08-07 MED ORDER — NA SULFATE-K SULFATE-MG SULF 17.5-3.13-1.6 GM/177ML PO SOLN: 1.0000 | Freq: Once | ORAL | 0 refills | Status: AC

## 2020-08-07 MED ORDER — HYDROCORTISONE ACETATE 25 MG RE SUPP: 25.0000 mg | Freq: Every day | RECTAL | 0 refills | Status: AC

## 2020-08-07 NOTE — Progress Notes (Signed)
Wyline Mood MD, MRCP(U.K) [address]  [address]  [place], [place] [address]  Main: [phone]  Fax: [phone]   Gastroenterology Consultation  Referring Provider:     Beverley Fiedler, NP Primary Care Physician:  Patient, No Pcp Per Primary Gastroenterologist:  Dr. Wyline Mood  Reason for Consultation:     Referred for hemorrhoids        HPI:   Jasmine Simmons is a 70 y.o. y/o female has been referred to see me for hemorrhoids in December 2021.Admitted in December 2021 to the hospital tremors, nausea, vomiting.  Was found to have acute kidney injury treated with IV fluids and discharged home  07/19/2020: Hemoglobin 10.7 g: Creatinine 2.1, magnesium 2.0, blood glucose 109  She states that she has had issues with perianal burning sensation during bowel movements.  She has had chronic diarrhea for many years.  Previously attributed to lithium.  Not been evaluated.  Last colonoscopy was over 10 years.  No weight loss.  Denies any NSAID use.  She is on Januvia for the past 2 years.  Prior to that also was still having diarrhea.  Denies any perianal pain.  Denies any rectal bleeding in a regular basis.  Has tried conservative management which has helped to a degree.  Past Medical History:  Diagnosis Date  . Bipolar 1 disorder [facility])     Past Surgical History:  Procedure Laterality Date  . CESAREAN SECTION      Prior to Admission medications   Medication Sig Start Date End Date Taking? Authorizing Provider  HYDROCORTISONE ACE, RECTAL, 30 MG SUPP Place 1 suppository rectally 2 (two) times daily as needed. 07/08/20   [provider]  levothyroxine (SYNTHROID, LEVOTHROID) 100 MCG tablet Take 100 mcg by mouth daily before breakfast.    [provider]  LORazepam (ATIVAN) 1 MG tablet Take 1 mg by mouth every 8 (eight) hours.    [provider]  mirtazapine (REMERON) 15 MG tablet Take 15 mg by mouth at bedtime.    [provider]  sertraline  (ZOLOFT) 100 MG tablet Take 100 mg by mouth daily. 09/14/12   [provider]  sitaGLIPtin-metformin (JANUMET) 50-1000 MG tablet Take 1 tablet by mouth 2 (two) times daily with a meal.    [provider]    Family History  Family history unknown: Yes     Social History   Tobacco Use  . Smoking status: Never Smoker  . Smokeless tobacco: Never Used  Vaping Use  . Vaping Use: Never used  Substance Use Topics  . Alcohol use: Never  . Drug use: Never    Allergies as of 08/07/2020  . (No Known Allergies)    Review of Systems:    All systems reviewed and negative except where noted in HPI.   Physical Exam:  LMP  (LMP Unknown)  No LMP recorded (lmp unknown). Patient is postmenopausal. Psych:  Alert and cooperative. Normal mood and affect. General:   Alert,  Well-developed, well-nourished, pleasant and cooperative in NAD Head:  Normocephalic and atraumatic. Eyes:  Sclera clear, no icterus.   Conjunctiva pink. Ears:  Normal auditory acuity.   Neurologic:  Alert and oriented x3;  grossly normal neurologically. Psych:  Alert and cooperative. Normal mood and affect.  Imaging Studies: US RENAL  Result Date: 07/14/2020 CLINICAL DATA:  Acute renal failure EXAM: RENAL / URINARY TRACT ULTRASOUND COMPLETE COMPARISON:  None FINDINGS: Right Kidney: Renal measurements: 10.7 x 5.2 x 5.3 cm = volume: 157 mL.  Normal morphology without mass or hydronephrosis. Left Kidney: Renal measurements: 11.4 x 5.4 x 4.2 cm = volume: 137 mL. Normal cortical thickness and echogenicity. Small cyst at mid to inferior pole 16 x 15 x 14 mm. No additional mass, hydronephrosis or shadowing calcification. Bladder: Appears normal for degree of bladder distention. Other: Incidentally noted increased echogenicity of the liver, question fatty infiltration of this can be seen with cirrhosis and some infiltrative disorders. IMPRESSION: Small LEFT renal cyst. Otherwise negative renal ultrasound. Suspected fatty  infiltration of liver as above. Electronically Signed   By: Ulyses Southward M.D.   On: 07/14/2020 10:44    Assessment and Plan:   Jasmine Simmons is a 70 y.o. y/o female has been referred for hemorrhoids.  Also has chronic diarrhea not been evaluated.  Likely part of it is due to lithium.  Plan 1.  Hemorrhoids counseled on perianal hygiene.  Provided patient information 2.  Anusol suppository once at night daily for 5 days and then stop. 3.  Chronic diarrhea will rule out infection, check for inflammation and perform colonoscopy to evaluate hemorrhoids as well rule out microscopic colitis by taking random colon biopsies. 4.  If hemorrhoids are no better at follow-up visit we will consider banding.  I have discussed alternative options, risks & benefits,  which include, but are not limited to, bleeding, infection, perforation,respiratory complication & drug reaction.  The patient agrees with this plan & written consent will be obtained.     Follow up in 6 weeks   Dr Wyline Mood MD,MRCP(U.K)\

## 2020-08-07 NOTE — Patient Instructions (Signed)
Hemorrhoids Hemorrhoids are swollen veins that may develop:  In the butt (rectum). These are called internal hemorrhoids.  Around the opening of the butt (anus). These are called external hemorrhoids. Hemorrhoids can cause pain, itching, or bleeding. Most of the time, they do not cause serious problems. They usually get better with diet changes, lifestyle changes, and other home treatments. What are the causes? This condition may be caused by:  Having trouble pooping (constipation).  Pushing hard (straining) to poop.  Watery poop (diarrhea).  Pregnancy.  Being very overweight (obese).  Sitting for long periods of time.  Heavy lifting or other activity that causes you to strain.  Anal sex.  Riding a bike for a long period of time. What are the signs or symptoms? Symptoms of this condition include:  Pain.  Itching or soreness in the butt.  Bleeding from the butt.  Leaking poop.  Swelling in the area.  One or more lumps around the opening of your butt. How is this diagnosed? A doctor can often diagnose this condition by looking at the affected area. The doctor may also:  Do an exam that involves feeling the area with a gloved hand (digital rectal exam).  Examine the area inside your butt using a small tube (anoscope).  Order blood tests. This may be done if you have lost a lot of blood.  Have you get a test that involves looking inside the colon using a flexible tube with a camera on the end (sigmoidoscopy or colonoscopy). How is this treated? This condition can usually be treated at home. Your doctor may tell you to change what you eat, make lifestyle changes, or try home treatments. If these do not help, procedures can be done to remove the hemorrhoids or make them smaller. These may involve:  Placing rubber bands at the base of the hemorrhoids to cut off their blood supply.  Injecting medicine into the hemorrhoids to shrink them.  Shining a type of light  energy onto the hemorrhoids to cause them to fall off.  Doing surgery to remove the hemorrhoids or cut off their blood supply. Follow these instructions at home: Eating and drinking  Eat foods that have a lot of fiber in them. These include whole grains, beans, nuts, fruits, and vegetables.  Ask your doctor about taking products that have added fiber (fibersupplements).  Reduce the amount of fat in your diet. You can do this by: ? Eating low-fat dairy products. ? Eating less red meat. ? Avoiding processed foods.  Drink enough fluid to keep your pee (urine) pale yellow.   Managing pain and swelling  Take a warm-water bath (sitz bath) for 20 minutes to ease pain. Do this 3-4 times a day. You may do this in a bathtub or using a portable sitz bath that fits over the toilet.  If told, put ice on the painful area. It may be helpful to use ice between your warm baths. ? Put ice in a plastic bag. ? Place a towel between your skin and the bag. ? Leave the ice on for 20 minutes, 2-3 times a day.   General instructions  Take over-the-counter and prescription medicines only as told by your doctor. ? Medicated creams and medicines may be used as told.  Exercise often. Ask your doctor how much and what kind of exercise is best for you.  Go to the bathroom when you have the urge to poop. Do not wait.  Avoid pushing too hard when you poop.    Keep your butt dry and clean. Use wet toilet paper or moist towelettes after pooping.  Do not sit on the toilet for a long time.  Keep all follow-up visits as told by your doctor. This is important. Contact a doctor if you:  Have pain and swelling that do not get better with treatment or medicine.  Have trouble pooping.  Cannot poop.  Have pain or swelling outside the area of the hemorrhoids. Get help right away if you have:  Bleeding that will not stop. Summary  Hemorrhoids are swollen veins in the butt or around the opening of the  butt.  They can cause pain, itching, or bleeding.  Eat foods that have a lot of fiber in them. These include whole grains, beans, nuts, fruits, and vegetables.  Take a warm-water bath (sitz bath) for 20 minutes to ease pain. Do this 3-4 times a day. This information is not intended to replace advice given to you by your health care provider. Make sure you discuss any questions you have with your health care provider. Document Revised: 07/14/2018 Document Reviewed: 11/25/2017 [employer] Patient Education  2021 [employer]  

## 2020-08-12 ENCOUNTER — Telehealth: Payer: Self-pay

## 2020-08-12 NOTE — Telephone Encounter (Signed)
Patient LVM requesting to cancel her colonoscopy with Dr. Tobi Bastos scheduled on 08/21/20. Returned her call.  LVM asking her to call the office back to inquire why she was canceling, and see if she wants to reschedule.  Thanks,  [place], [place]

## 2020-08-13 IMAGING — CR DG CHEST 2V
3 series · 3 of 3 positions shown · non-contrast
Comparison: None.

CLINICAL DATA: Flu like symptoms and body aches.

EXAM:
CHEST - 2 VIEW

[chest lat (1 of 2)]
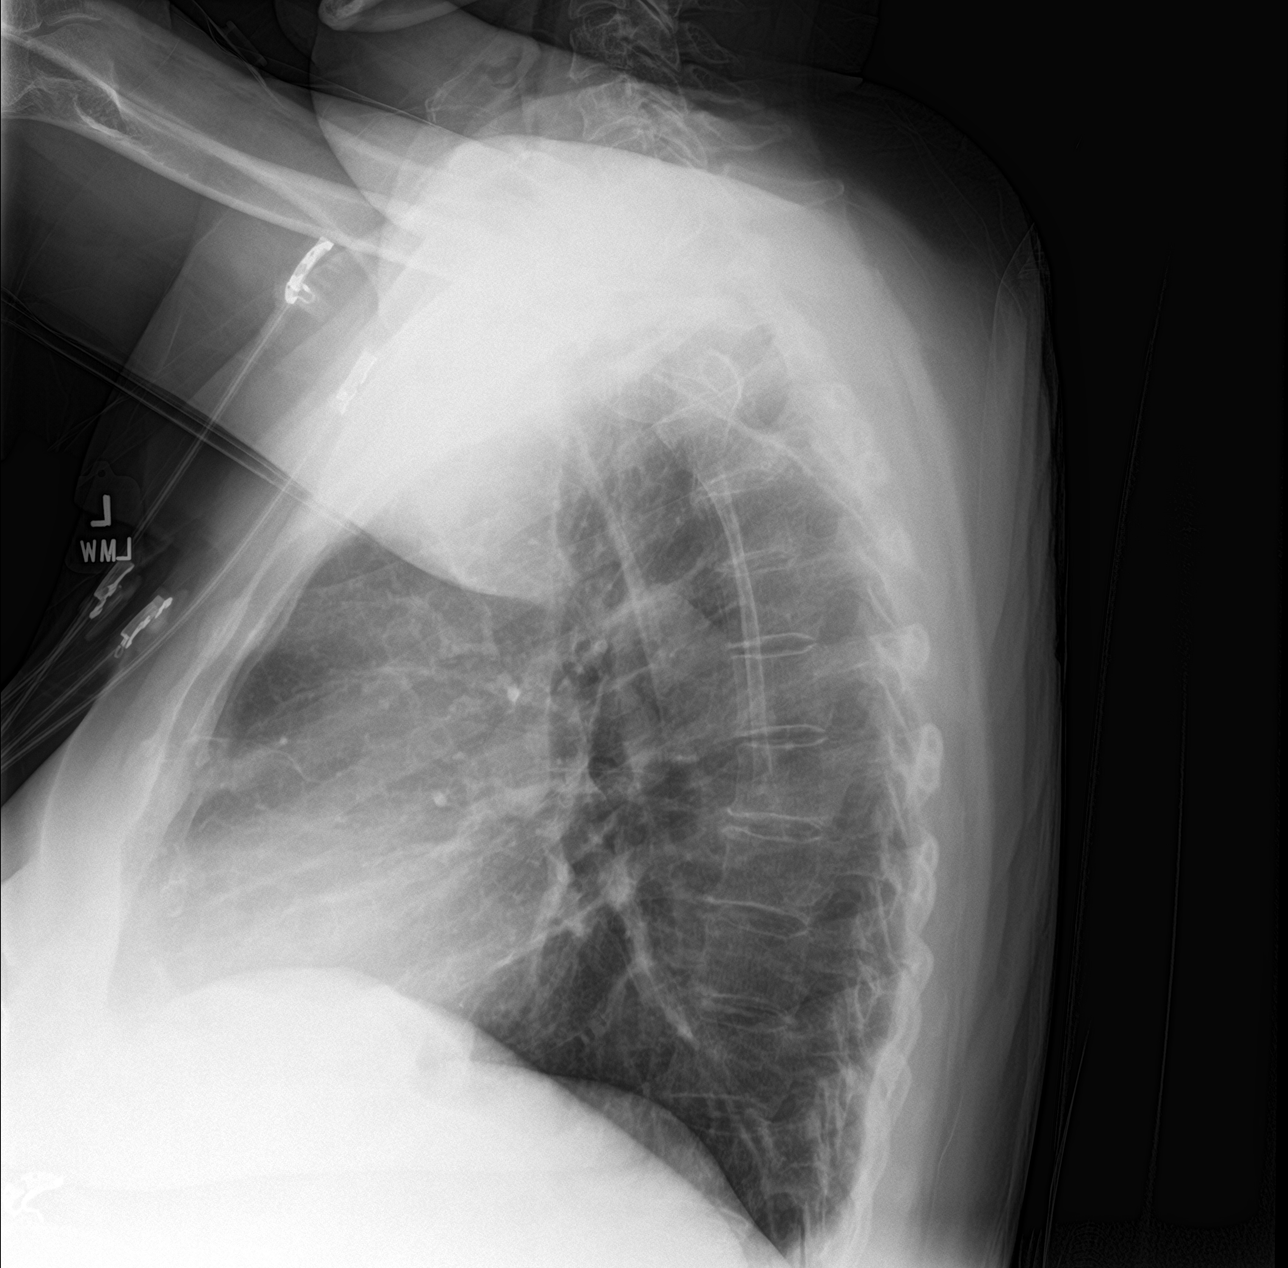

[chest ap]
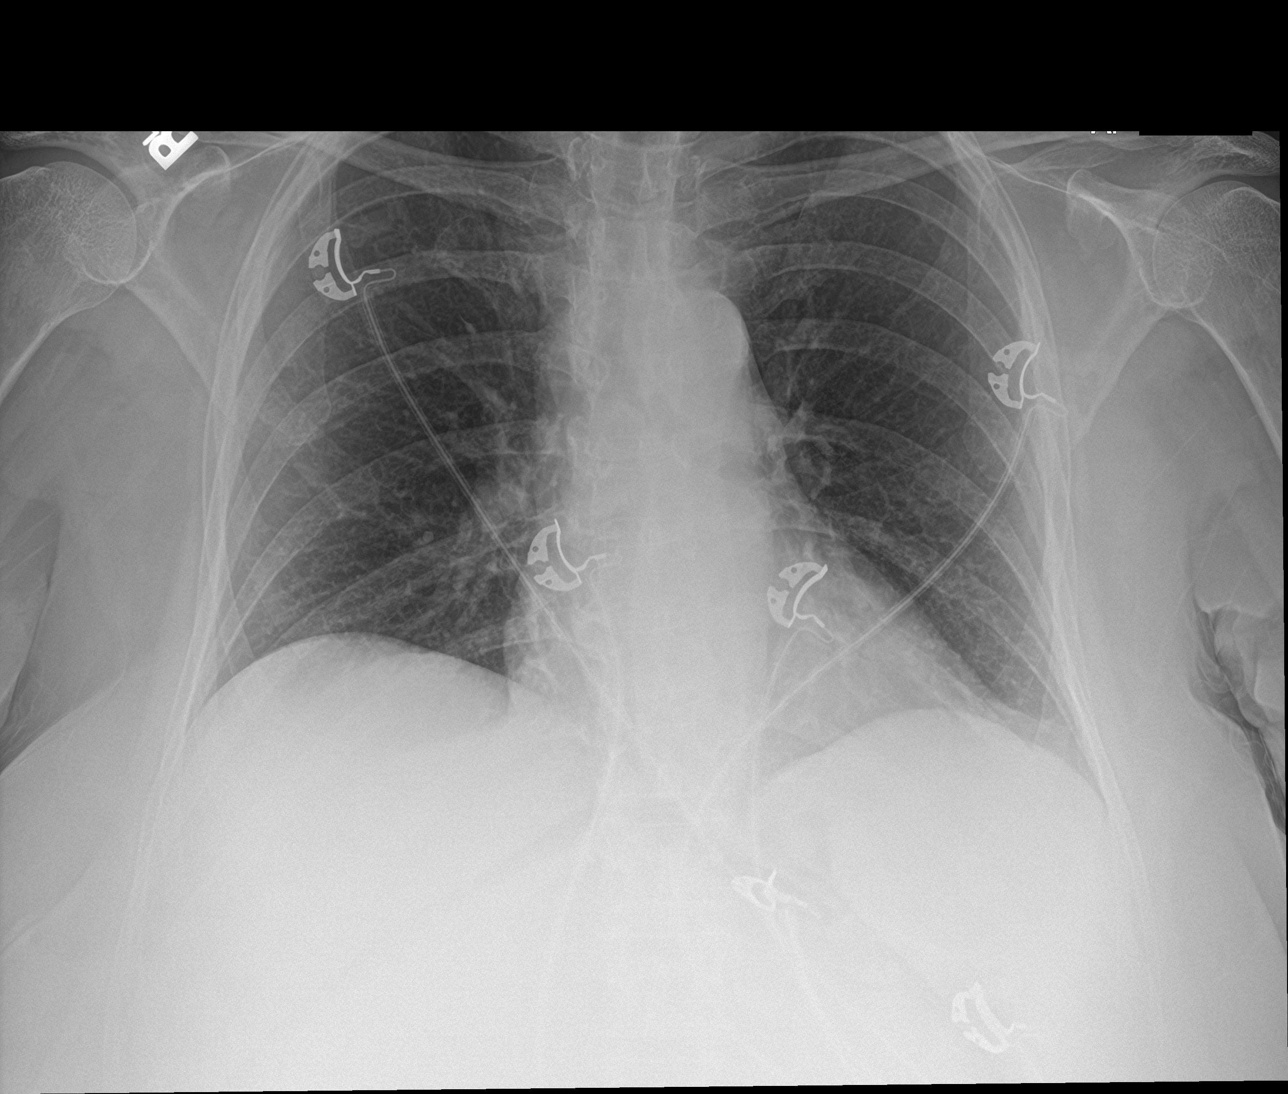

[chest lat (2 of 2)]
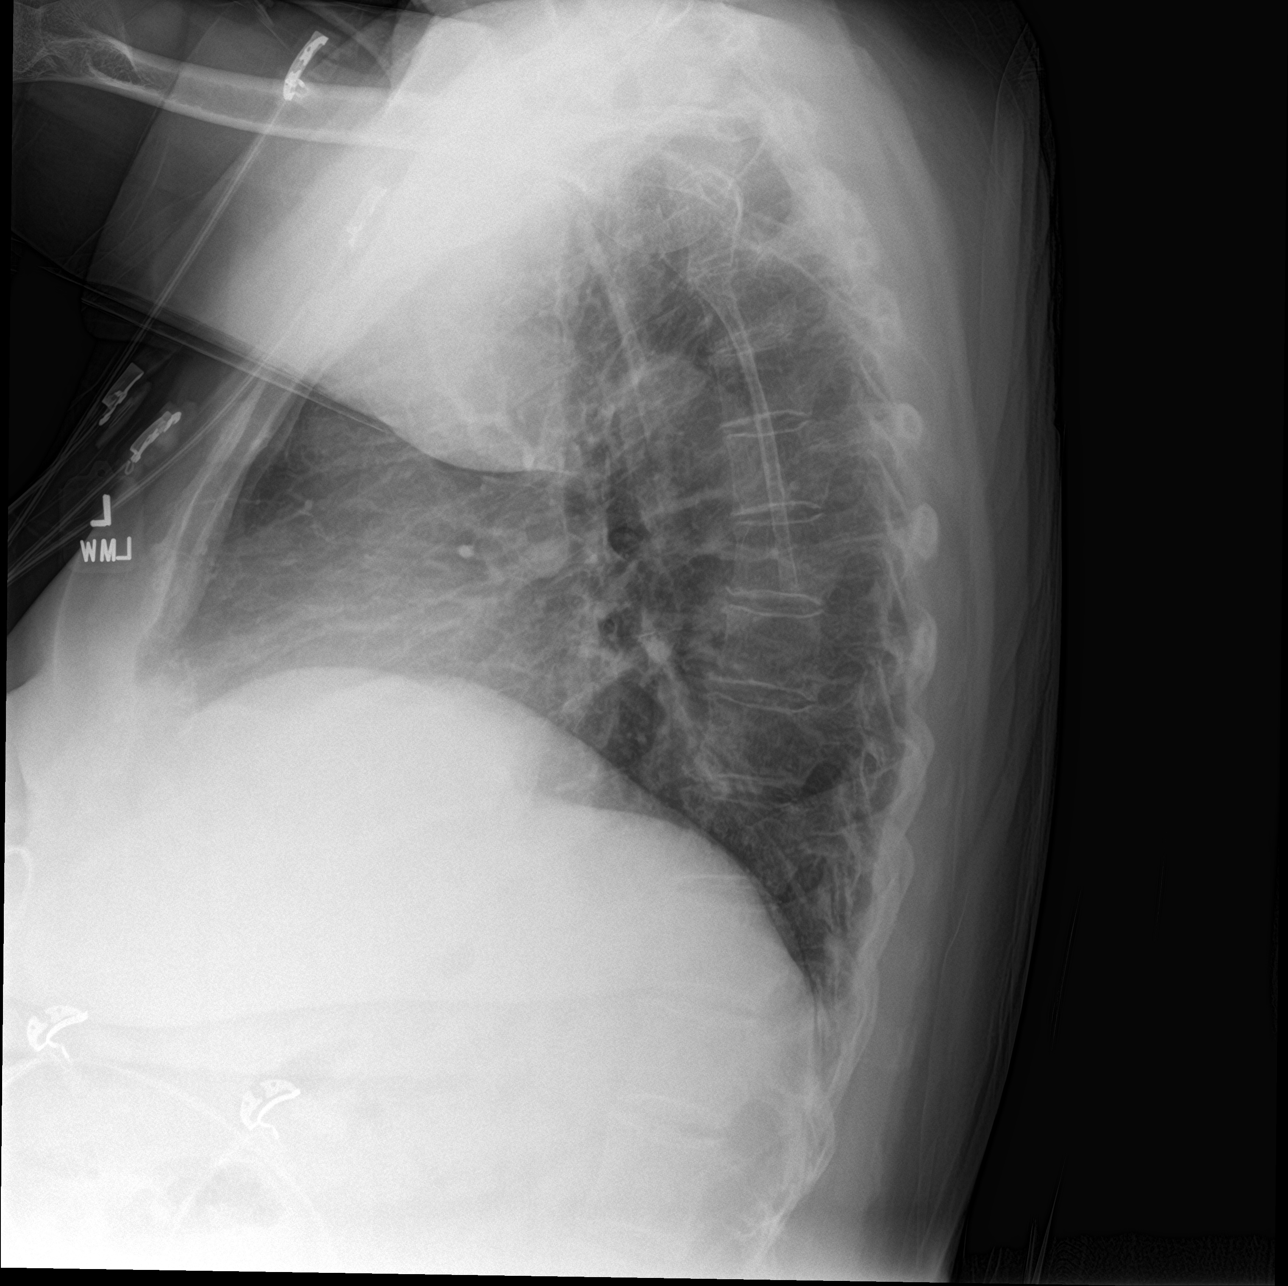

[3 of 3 positions shown; findings below may reference images not displayed]

FINDINGS: The heart size and mediastinal contours are within normal limits.
Both lungs are clear. The visualized skeletal structures are
unremarkable.
IMPRESSION: No active cardiopulmonary disease.

## 2020-08-14 ENCOUNTER — Telehealth: Payer: Self-pay

## 2020-08-14 NOTE — Telephone Encounter (Signed)
Returned patients call.  LVM to let her know that her colonoscopy scheduled for 08/21/20 has been canceled as requested.  Thanks,  [place], [place]

## 2020-08-21 ENCOUNTER — Ambulatory Visit: Admit: 2020-08-21 | Payer: Self-pay | Admitting: Gastroenterology

## 2020-08-21 SURGERY — COLONOSCOPY WITH PROPOFOL
Anesthesia: General

## 2020-09-03 ENCOUNTER — Ambulatory Visit: Payer: Self-pay | Admitting: Gastroenterology

## 2022-07-16 IMAGING — US US RENAL
1 series · 14 of 25 positions shown · non-contrast
Comparison: None

CLINICAL DATA: Acute renal failure

EXAM:
RENAL / URINARY TRACT ULTRASOUND COMPLETE

[Series 1: us renal · 14 of 38 slices shown]
[im 1/38]
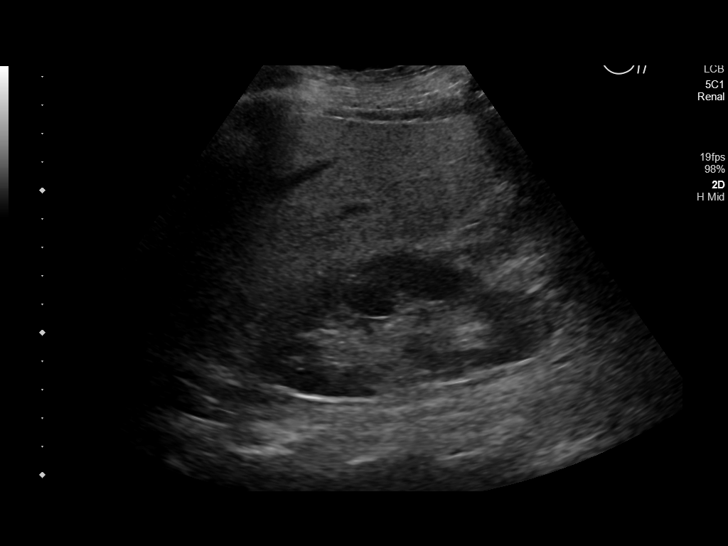
[im 4/38]
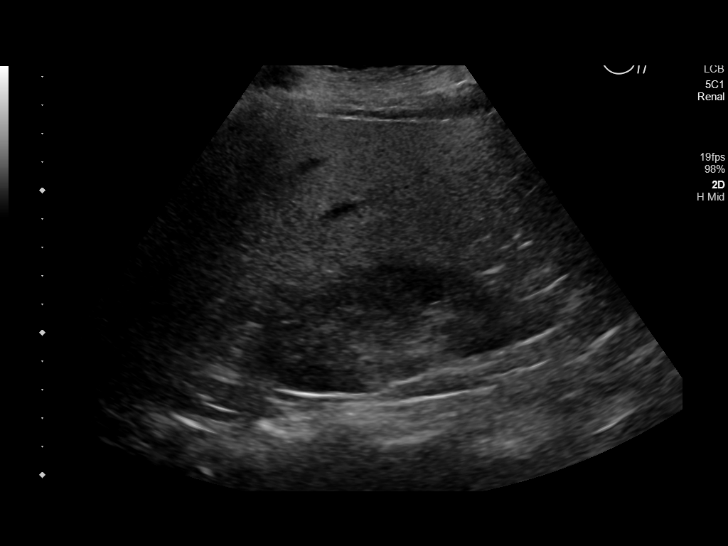
[im 7/38]
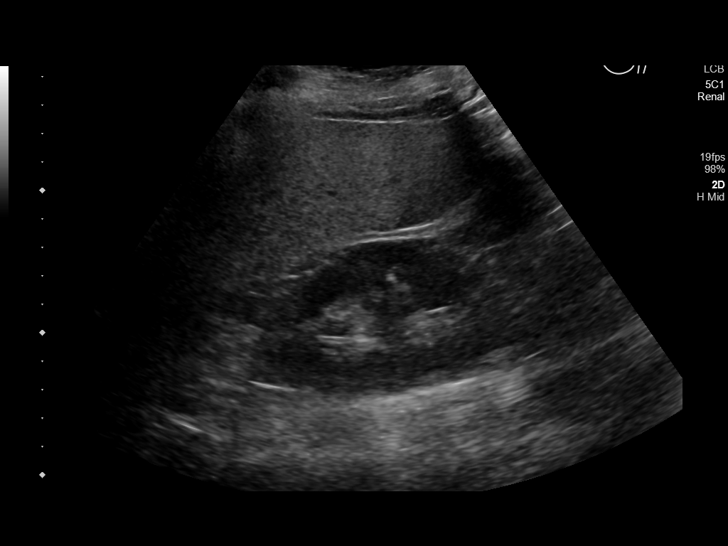
[im 10/38]
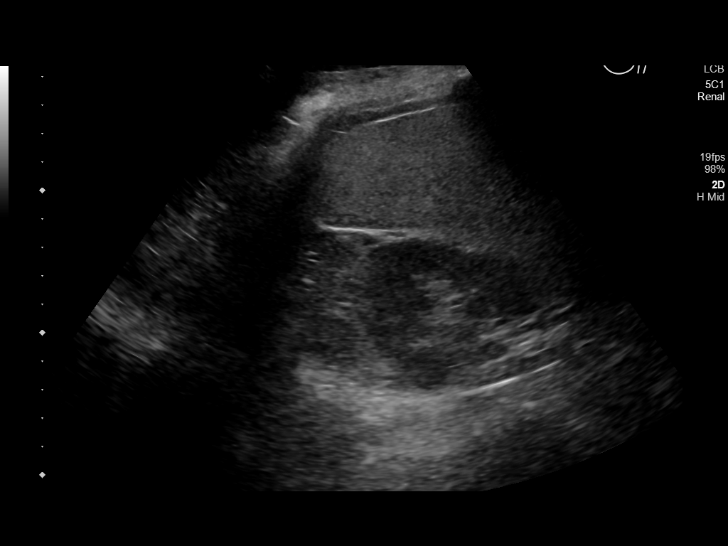
[im 13/38]
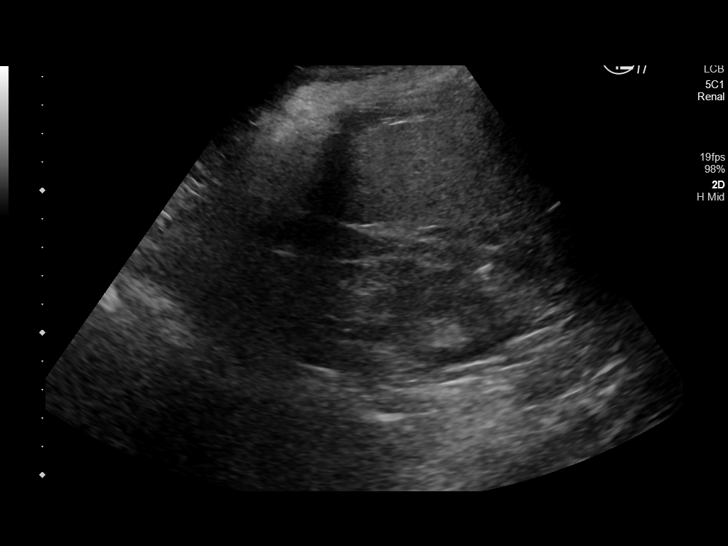
[im 14/38]
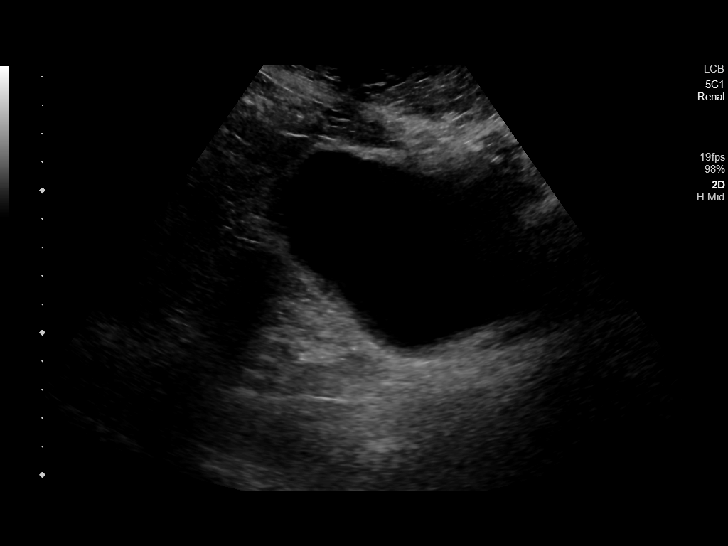
[im 17/38]
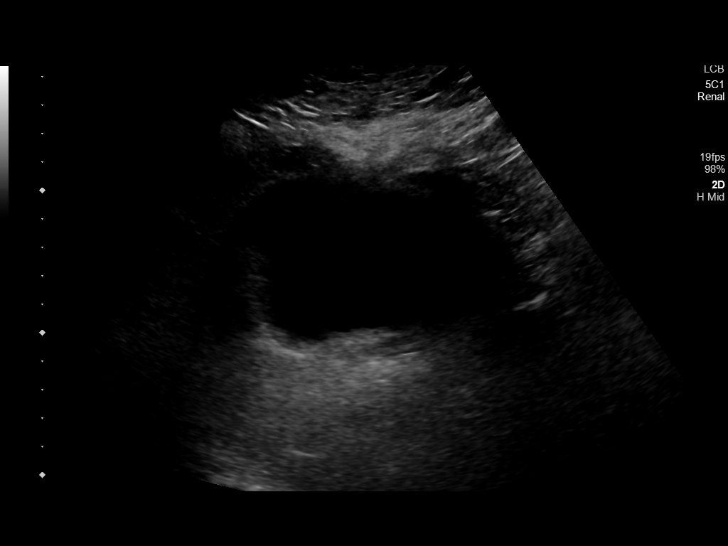
[im 21/38]
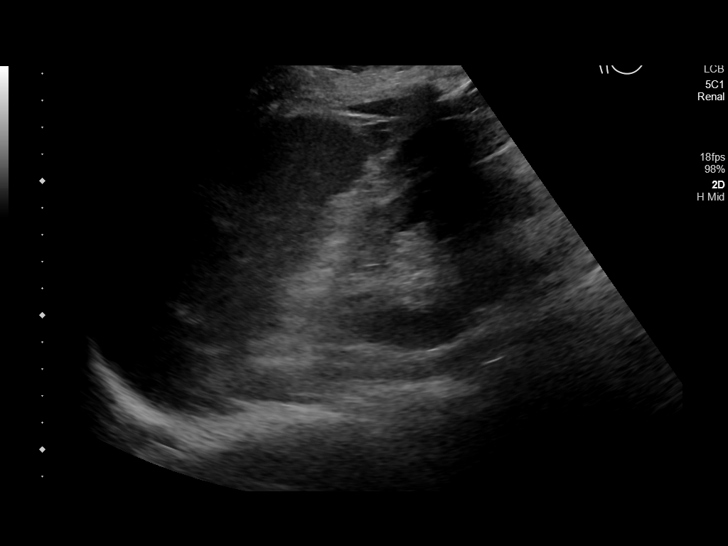
[im 24/38]
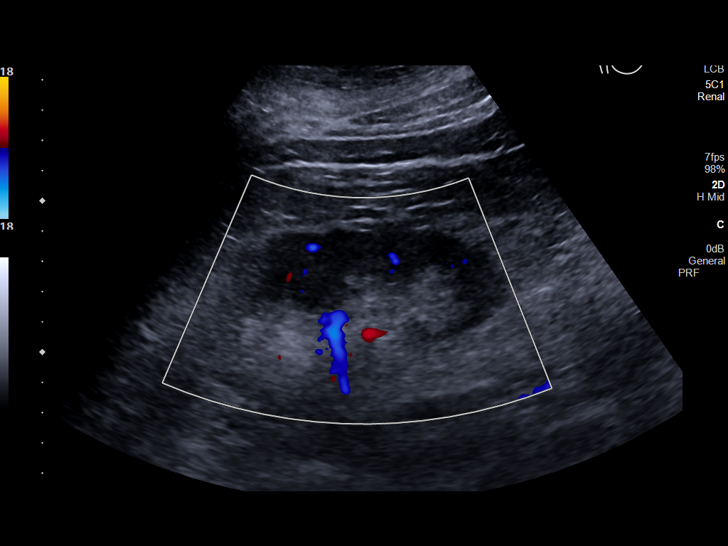
[im 25/38]
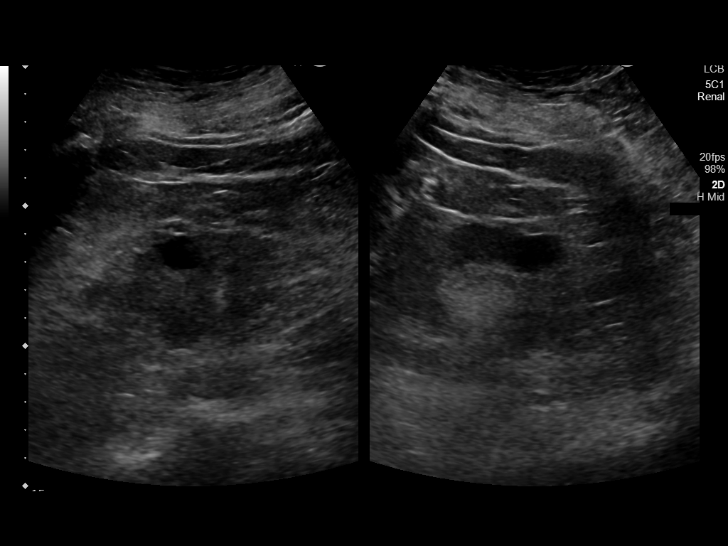
[im 28/38]
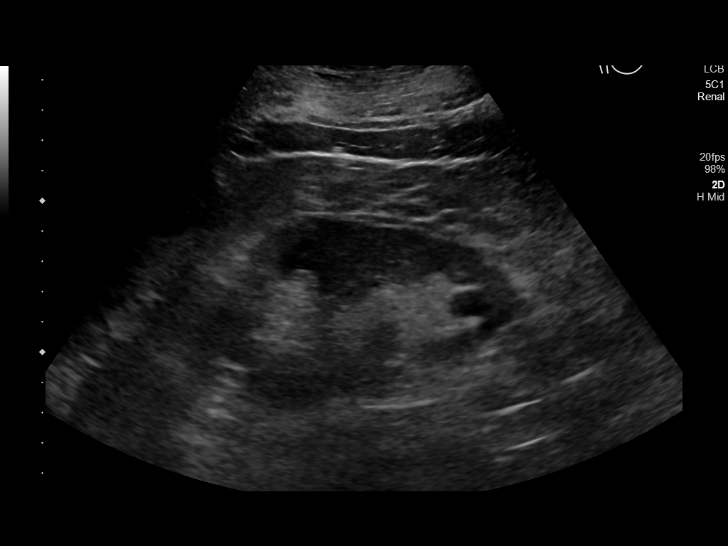
[im 31/38]
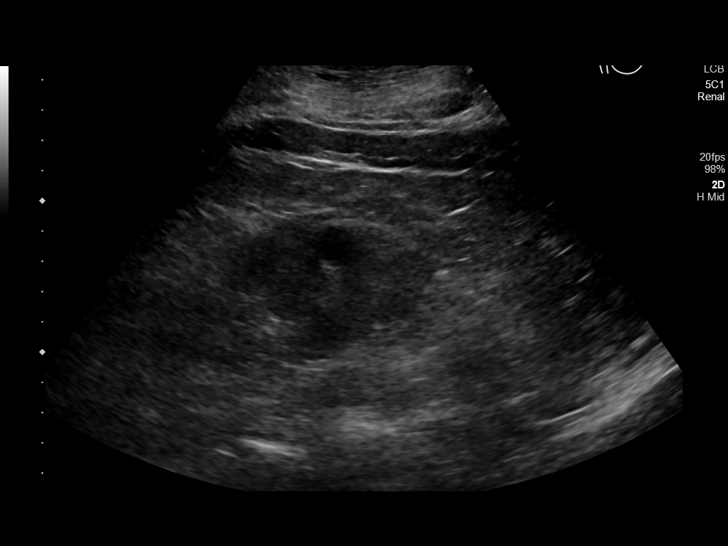
[im 34/38]
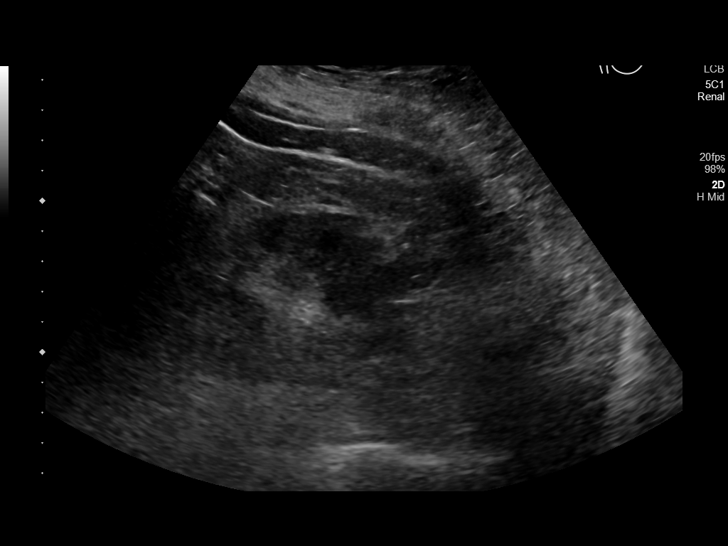
[im 38/38]
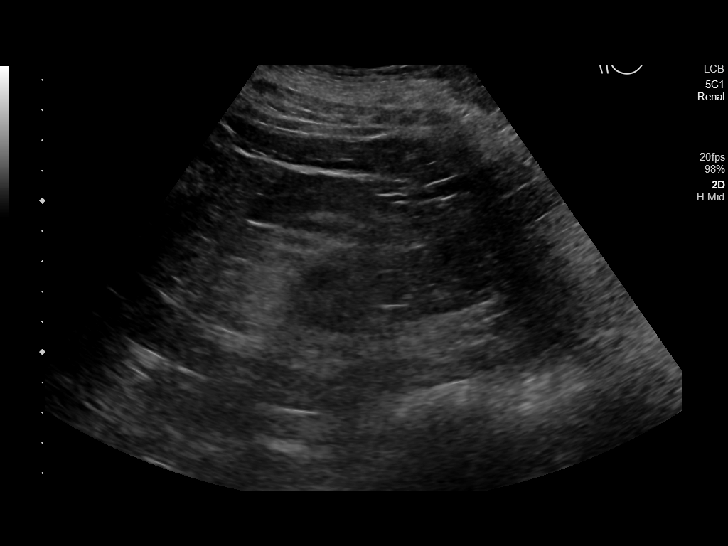

[14 of 25 positions shown; findings below may reference images not displayed]

FINDINGS: Right Kidney:

Renal measurements: 10.7 x 5.2 x 5.3 cm = volume: 157 mL. Normal
morphology without mass or hydronephrosis.

Left Kidney:

Renal measurements: 11.4 x 5.4 x 4.2 cm = volume: 137 mL. Normal
cortical thickness and echogenicity. Small cyst at mid to inferior
pole 16 x 15 x 14 mm. No additional mass, hydronephrosis or
shadowing calcification.

Bladder:

Appears normal for degree of bladder distention.

Other:

Incidentally noted increased echogenicity of the liver, question
fatty infiltration of this can be seen with cirrhosis and some
infiltrative disorders.
IMPRESSION: Small LEFT renal cyst.

Otherwise negative renal ultrasound.

Suspected fatty infiltration of liver as above.
